# Patient Record
Sex: Female | Born: 1960 | Race: White | Hispanic: No | Marital: Single | Smoking: Former smoker
Health system: Southern US, Community
[De-identification: ages and names within clinical notes are randomized; demographics above are authoritative.]

## PROBLEM LIST (undated history)

## (undated) DIAGNOSIS — M549 Dorsalgia, unspecified: Secondary | ICD-10-CM

## (undated) DIAGNOSIS — L989 Disorder of the skin and subcutaneous tissue, unspecified: Secondary | ICD-10-CM

## (undated) DIAGNOSIS — F419 Anxiety disorder, unspecified: Secondary | ICD-10-CM

## (undated) DIAGNOSIS — E039 Hypothyroidism, unspecified: Secondary | ICD-10-CM

## (undated) HISTORY — PX: OTHER SURGICAL HISTORY: SHX169

## (undated) HISTORY — DX: Dorsalgia, unspecified: M54.9

## (undated) HISTORY — DX: Anxiety disorder, unspecified: F41.9

## (undated) HISTORY — PX: NO PAST SURGERIES: SHX2092

## (undated) HISTORY — PX: TUBAL LIGATION: SHX77

## (undated) HISTORY — DX: Disorder of the skin and subcutaneous tissue, unspecified: L98.9

---

## 1999-03-07 ENCOUNTER — Other Ambulatory Visit: Admission: RE | Admit: 1999-03-07 | Discharge: 1999-03-07 | Payer: Self-pay | Admitting: Obstetrics & Gynecology

## 2001-11-26 ENCOUNTER — Other Ambulatory Visit: Admission: RE | Admit: 2001-11-26 | Discharge: 2001-11-26 | Payer: Self-pay | Admitting: Obstetrics & Gynecology

## 2002-01-06 ENCOUNTER — Emergency Department (HOSPITAL_COMMUNITY): Admission: EM | Admit: 2002-01-06 | Discharge: 2002-01-06 | Payer: Self-pay | Admitting: Emergency Medicine

## 2003-04-29 ENCOUNTER — Encounter: Payer: Self-pay | Admitting: Obstetrics

## 2003-04-29 ENCOUNTER — Ambulatory Visit (HOSPITAL_COMMUNITY): Admission: RE | Admit: 2003-04-29 | Discharge: 2003-04-29 | Payer: Self-pay | Admitting: Obstetrics

## 2005-04-11 ENCOUNTER — Encounter: Admission: RE | Admit: 2005-04-11 | Discharge: 2005-04-11 | Payer: Self-pay | Admitting: Family Medicine

## 2005-12-18 ENCOUNTER — Emergency Department (HOSPITAL_COMMUNITY): Admission: EM | Admit: 2005-12-18 | Discharge: 2005-12-18 | Payer: Self-pay | Admitting: Emergency Medicine

## 2007-08-16 ENCOUNTER — Encounter: Admission: RE | Admit: 2007-08-16 | Discharge: 2007-08-16 | Payer: Self-pay | Admitting: *Deleted

## 2011-09-21 ENCOUNTER — Emergency Department (INDEPENDENT_AMBULATORY_CARE_PROVIDER_SITE_OTHER)
Admission: EM | Admit: 2011-09-21 | Discharge: 2011-09-21 | Disposition: A | Discharge status: Home / Self Care | Payer: Self-pay | Source: Home / Self Care | Attending: Family Medicine | Admitting: Family Medicine

## 2011-09-21 ENCOUNTER — Encounter (HOSPITAL_COMMUNITY): Payer: Self-pay | Admitting: *Deleted

## 2011-09-21 DIAGNOSIS — G4700 Insomnia, unspecified: Secondary | ICD-10-CM

## 2011-09-21 MED ORDER — ESZOPICLONE 1 MG PO TABS: 1.0000 mg | ORAL_TABLET | Freq: Every day | ORAL | Status: DC

## 2011-09-21 NOTE — ED Notes (Signed)
Pt  Reports     Symptoms   Of  Body  Aches    In  Joints   As  Well  As  Some  Insomnia     -  Pt  Reports  She  May  Be  Starting  menapause          She  reports  The  Symptoms  Of  Pain  Have  Been  For  About 6  Months       She  Is  Awake as  Well as  Alert and  opriented  Appears in no acute  Distress  Speaking in  Complete  sentances

## 2011-09-21 NOTE — Discharge Instructions (Signed)
Insomnia Insomnia means you have trouble falling or staying asleep. It affects about one person in three at different times and is usually related to stress from work, school, or personal relations. Insomnia is also a sign of depression or anxiety. Other medical problems that cause insomnia include conditions that cause pain, night leg cramps, coughing, shortness of breath, urinary problems, and fevers. Sleep apnea is an abnormal breathing pattern at night that can cause insomnia and loud snoring. Certain medications and excess intake of caffeine drinks (coffee, tea, colas) can also interfere with normal sleep. Treatment for insomnia depends on the cause. Besides specific medical treatment, the following measures can help you relax and get better sleep. Get regular exercise every day, at least several hours before bed time. Try to get to bed at the same time every night. Take a hot bath before retiring to help you relax. Do not stay in bed if you are unable to sleep. During the daytime avoid staying in bed to watch television, eat, or read. Reduce unwanted noise and light in your room. Keep your room at a comfortable temperature. Avoid alcohol as it causes one to sleep less soundly, may cause you to awaken during the night, and can leave you feeling groggy the next day. Using a mild sedative prescribed or suggested by your caregiver may be needed, but the daily use of sleeping pills is not recommended. Anti-depressant medicines can improve sleep in people with depression. Please call your doctor for follow up care to better understand the cause and proper treatment of your insomnia. Document Released: 08/10/2004 Document Revised: 06/22/2011 Document Reviewed: 07/03/2005 [facility] Patient Information 2012 [place], [place].  Avoid caffeine products.

## 2011-09-21 NOTE — ED Provider Notes (Signed)
History     CSN: [id number]  Arrival date & time 09/21/11  1109   First MD Initiated Contact with Patient 09/21/11 1304      Chief Complaint  Patient presents with  . Generalized Body Aches    (Consider location/radiation/quality/duration/timing/severity/associated sxs/prior treatment) HPI Comments: The patient reports having problems with insomnia. She also has had myalgias of varing places. She relates this to menopausal symptoms . She states no period since January. Prior to that they had been very irregular. She does not have a pcp and states she does not have insurance.   The history is provided by the patient.    History reviewed. No pertinent past medical history.  Past Surgical History  Procedure Date  . Btl     History reviewed. No pertinent family history.  History  Substance Use Topics  . Smoking status: Never Smoker   . Smokeless tobacco: Not on file  . Alcohol Use: Yes     occasonal      OB History    Grav Para Term Preterm Abortions TAB SAB Ect Mult Living                  Review of Systems  Constitutional: Negative.   HENT: Negative.   Respiratory: Negative.   Cardiovascular: Negative.   Gastrointestinal: Negative.   Genitourinary: Negative.   Neurological: Negative.     Allergies  Review of patient's allergies indicates no known allergies.  Home Medications   Current Outpatient Rx  Name Route Sig Dispense Refill  . ESZOPICLONE 1 MG PO TABS Oral Take 1 tablet (1 mg total) by mouth at bedtime. Take immediately before bedtime 12 tablet 0    BP 141/64  Pulse 80  Temp(Src) 98.4 F (36.9 C) (Oral)  Resp 18  SpO2 100%  Physical Exam  Nursing note and vitals reviewed. Constitutional: She is oriented to person, place, and time. She appears well-developed and well-nourished.  HENT:  Head: Normocephalic and atraumatic.  Mouth/Throat: Oropharynx is clear and moist.  Neck: Normal range of motion. Neck supple. No thyromegaly present.    Cardiovascular: Normal rate, regular rhythm and normal heart sounds.   Pulmonary/Chest: Effort normal and breath sounds normal.  Abdominal: Bowel sounds are normal.  Musculoskeletal: Normal range of motion. She exhibits no edema.  Lymphadenopathy:    She has no cervical adenopathy.  Neurological: She is alert and oriented to person, place, and time. She has normal reflexes.  Skin: Skin is warm and dry.    ED Course  Procedures (including critical care time)  Labs Reviewed - No data to display No results found.   1. Insomnia       MDM          Randa Spike, MD 09/21/11 1335

## 2011-11-24 ENCOUNTER — Other Ambulatory Visit: Payer: Self-pay | Admitting: Family Medicine

## 2011-11-24 DIAGNOSIS — Z1231 Encounter for screening mammogram for malignant neoplasm of breast: Secondary | ICD-10-CM

## 2011-12-03 ENCOUNTER — Emergency Department (HOSPITAL_COMMUNITY): Payer: BC Managed Care – PPO

## 2011-12-03 ENCOUNTER — Observation Stay (HOSPITAL_COMMUNITY)
Admission: EM | Admit: 2011-12-03 | Discharge: 2011-12-04 | Disposition: A | Discharge status: Home / Self Care | Payer: BC Managed Care – PPO | Attending: Emergency Medicine | Admitting: Emergency Medicine

## 2011-12-03 ENCOUNTER — Encounter (HOSPITAL_COMMUNITY): Payer: Self-pay | Admitting: Emergency Medicine

## 2011-12-03 DIAGNOSIS — R110 Nausea: Secondary | ICD-10-CM | POA: Insufficient documentation

## 2011-12-03 DIAGNOSIS — R0600 Dyspnea, unspecified: Secondary | ICD-10-CM

## 2011-12-03 DIAGNOSIS — R079 Chest pain, unspecified: Secondary | ICD-10-CM | POA: Insufficient documentation

## 2011-12-03 DIAGNOSIS — R42 Dizziness and giddiness: Principal | ICD-10-CM | POA: Insufficient documentation

## 2011-12-03 DIAGNOSIS — R0602 Shortness of breath: Secondary | ICD-10-CM | POA: Insufficient documentation

## 2011-12-03 LAB — BASIC METABOLIC PANEL
BUN: 14 mg/dL (ref 6–23)
CO2: 21 mEq/L (ref 19–32)
Glucose, Bld: 102 mg/dL — ABNORMAL HIGH (ref 70–99)
Potassium: 4.4 mEq/L (ref 3.5–5.1)
Sodium: 134 mEq/L — ABNORMAL LOW (ref 135–145)

## 2011-12-03 LAB — CBC
HCT: 36.7 % (ref 36.0–46.0)
Hemoglobin: 12.5 g/dL (ref 12.0–15.0)
MCH: 29.3 pg (ref 26.0–34.0)
MCHC: 34.1 g/dL (ref 30.0–36.0)
RBC: 4.27 MIL/uL (ref 3.87–5.11)

## 2011-12-03 MED ORDER — LORAZEPAM 2 MG/ML IJ SOLN
1.0000 mg | Freq: Once | INTRAMUSCULAR | Status: AC
  Administered 2011-12-03: 1 mg via INTRAVENOUS
  Filled 2011-12-03: qty 1

## 2011-12-03 MED ORDER — SODIUM CHLORIDE 0.9 % IV BOLUS (SEPSIS)
1000.0000 mL | Freq: Once | INTRAVENOUS | Status: AC
  Administered 2011-12-03: 1000 mL via INTRAVENOUS

## 2011-12-03 MED ORDER — ASPIRIN 325 MG PO TABS
325.0000 mg | ORAL_TABLET | Freq: Once | ORAL | Status: AC
  Administered 2011-12-03: 325 mg via ORAL
  Filled 2011-12-03: qty 1

## 2011-12-03 NOTE — ED Notes (Signed)
BMI: 25.3 - actual height and weight obtained upon arrival to CDU.

## 2011-12-03 NOTE — ED Notes (Signed)
Er resident at bedside

## 2011-12-03 NOTE — ED Notes (Addendum)
C/o episode of chest pain for 1 1/2 hours yesterday.  States pain started again today around 3:30pm.  Pt very anxious.  Reports sob, dizziness, and nausea.  States she is concerned because BP was 147/70 at [facility] yesterday.  Intermittent chest pain under L breast that radiates to back.  Pt started hyperventilating on triage exam.

## 2011-12-03 NOTE — ED Notes (Signed)
Pt came to the ED complaining left sided CP, SOB and dizziness. This has been occuring for several days, intermittently. Pain is not radiating. No nausea or vomiting. No neurological deficits. Lung sounds clear.

## 2011-12-03 NOTE — ED Notes (Signed)
Report given to [place] RN. Pt transported to CDU 4

## 2011-12-03 NOTE — ED Provider Notes (Signed)
History     CSN: [id number]  Arrival date & time 12/03/11  Barry Brunner   First MD Initiated Contact with Patient 12/03/11 2033      Chief Complaint  Patient presents with  . Chest Pain    (Consider location/radiation/quality/duration/timing/severity/associated sxs/prior treatment) HPI Comments: Pt with intermittent nausea, lightheadedness, chest pain for 45 second episodes.  Several episodes since earlier today.  Otherwise doing well.  Symptoms currently resolved.  Had episode in ED where she felt like she could not breath and was hyperventilating which then resolved without intevention.  Lightheadedness worse upon standing.  Patient is a 51 y.o. female presenting with chest pain. The history is provided by the patient.  Chest Pain The chest pain began less than 1 hour ago. Duration of episode(s) is 45 seconds. Chest pain occurs rarely. The chest pain is unchanged. The severity of the pain is moderate. The quality of the pain is described as pressure-like. The pain does not radiate. Pertinent negatives for primary symptoms include no fever, no shortness of breath, no cough, no wheezing, no palpitations, no abdominal pain, no nausea, no vomiting and no altered mental status.     History reviewed. No pertinent past medical history.  Past Surgical History  Procedure Date  . Btl     No family history on file.  History  Substance Use Topics  . Smoking status: Former Games developer  . Smokeless tobacco: Not on file  . Alcohol Use: Yes     occasonal      OB History    Grav Para Term Preterm Abortions TAB SAB Ect Mult Living                  Review of Systems  Constitutional: Negative for fever and activity change.  HENT: Negative for congestion.   Eyes: Negative for visual disturbance.  Respiratory: Negative for cough, chest tightness, shortness of breath and wheezing.   Cardiovascular: Positive for chest pain. Negative for palpitations and leg swelling.  Gastrointestinal: Negative for  nausea, vomiting and abdominal pain.  Genitourinary: Negative for dysuria.  Skin: Negative for rash.  Neurological: Negative for syncope.  Psychiatric/Behavioral: Negative for behavioral problems and altered mental status.    Allergies  Shellfish allergy  Home Medications   Current Outpatient Rx  Name Route Sig Dispense Refill  . NAPROXEN 250 MG PO TABS Oral Take 250 mg by mouth daily as needed. For pain    . TRAMADOL HCL 50 MG PO TABS Oral Take 50 mg by mouth every 6 (six) hours as needed. For pain    . ZOLPIDEM TARTRATE 10 MG PO TABS Oral Take 10 mg by mouth at bedtime as needed. For sleep      BP 127/78  Pulse 75  Temp(Src) 98.2 F (36.8 C) (Oral)  Resp 16  SpO2 100%  LMP 11/26/2011  Physical Exam  Constitutional: She is oriented to person, place, and time. She appears well-developed and well-nourished.  HENT:  Head: Normocephalic and atraumatic.  Eyes: Conjunctivae and EOM are normal. Pupils are equal, round, and reactive to light. No scleral icterus.  Neck: Normal range of motion. Neck supple.  Cardiovascular: Normal rate and regular rhythm.  Exam reveals no gallop and no friction rub.   No murmur heard. Pulmonary/Chest: Effort normal and breath sounds normal. No respiratory distress. She has no wheezes. She has no rales. She exhibits no tenderness.  Abdominal: Soft. She exhibits no distension and no mass. There is no tenderness. There is no rebound and no  guarding.  Musculoskeletal: Normal range of motion.  Neurological: She is alert and oriented to person, place, and time. She has normal reflexes. No cranial nerve deficit.  Skin: Skin is warm and dry. No rash noted.  Psychiatric: She has a normal mood and affect. Her behavior is normal. Judgment and thought content normal.    ED Course  Procedures (including critical care time)   Date: 12/03/2011  Rate: 80  Rhythm: normal sinus rhythm  QRS Axis: normal  Intervals: normal  ST/T Wave abnormalities: normal   Conduction Disutrbances:none  Narrative Interpretation:   Old EKG Reviewed: none available    Labs Reviewed  BASIC METABOLIC PANEL - Abnormal; Notable for the following:    Sodium 134 (*)    Glucose, Bld 102 (*)    GFR calc non Af Amer 77 (*)    GFR calc Af Amer 90 (*)    All other components within normal limits  CBC  TROPONIN I  TROPONIN I  TROPONIN I   Dg Chest 2 View  12/03/2011  *RADIOLOGY REPORT*  Clinical Data: Shortness of breath. Chest pain.  CHEST - 2 VIEW  Comparison: None.  Findings: The heart size and pulmonary vascularity are normal and the lungs are clear.  No osseous abnormality.  IMPRESSION: Normal chest.  Original Report Authenticated By: Gwynn Burly, M.D.     1. Chest pain   2. Lightheadedness   3. Dyspnea       MDM  Pt with intermittent nausea, lightheadedness, chest pain for 45 second episodes.  Several episodes since earlier today.  Otherwise doing well.  Symptoms currently resolved.  Had episode in ED where she felt like she could not breath and was hyperventilating which then resolved without intevention.  Lightheadedness worse upon standing.  VSS and well appearing.  EKG unconcerning.  Initial labs unconcerning.  Very atypical presentation for PE given such intermittent symptoms.  Low suspicion for ACS but will rule out in CDU and obtain cardiac CT at pt request.        Army Chaco, MD 12/03/11 2326

## 2011-12-03 NOTE — ED Provider Notes (Signed)
Subjective Complains of lightheadedness shortness of breath nausea and intermittent chest pain onset this morning chest pain originates at back lasted a few seconds at a time, radiates to the front anterior chest pain lasts several minutes at a time she is presently pain-free patient also reports lightheadedness worse with standing she treated herself with a tramadol tablet earlier today without relief. Objective On exam alert nontoxic Glasgow Coma Score 15 lungs clear auscultation heart regular rate and rhythm abdomen nondistended nontender neurologic gait normal becomes looks slightly lightheaded on standing Assessment symptoms atypical for acute coronary syndrome. Had lengthy discussion with patient regarding outpatient cardiac workup versus CDU rule out protocol. Patient wishes overnight observation. Plan CDU chest pain rule out protocol  Doug Sou, MD 12/03/11 2326

## 2011-12-03 NOTE — ED Notes (Signed)
Patient transported to X-ray 

## 2011-12-03 NOTE — ED Notes (Signed)
Contact:   [phone] ( Alec-Daughter)

## 2011-12-03 NOTE — ED Notes (Signed)
Dr. Jacubawitz at bedside 

## 2011-12-03 NOTE — ED Notes (Signed)
I gave the patient a warm blanket. 

## 2011-12-04 ENCOUNTER — Observation Stay (HOSPITAL_COMMUNITY)
Admit: 2011-12-04 | Discharge: 2011-12-04 | Disposition: A | Discharge status: Home / Self Care | Payer: BC Managed Care – PPO | Attending: Emergency Medicine | Admitting: Emergency Medicine

## 2011-12-04 ENCOUNTER — Observation Stay (HOSPITAL_COMMUNITY): Payer: BC Managed Care – PPO

## 2011-12-04 LAB — TROPONIN I
Troponin I: <0.3 ng/mL (ref <0.30)
Troponin I: <0.3 ng/mL (ref <0.30)

## 2011-12-04 MED ORDER — ZOLPIDEM TARTRATE 5 MG PO TABS
ORAL_TABLET | ORAL | Status: AC
  Filled 2011-12-04: qty 1

## 2011-12-04 MED ORDER — METOPROLOL TARTRATE 1 MG/ML IV SOLN
INTRAVENOUS | Status: AC
  Filled 2011-12-04: qty 10

## 2011-12-04 MED ORDER — ONDANSETRON HCL 4 MG PO TABS: 4.0000 mg | ORAL_TABLET | ORAL | Status: AC | PRN

## 2011-12-04 MED ORDER — NITROGLYCERIN 0.4 MG SL SUBL
0.4000 mg | SUBLINGUAL_TABLET | Freq: Once | SUBLINGUAL | Status: AC
  Administered 2011-12-04: 0.4 mg via SUBLINGUAL

## 2011-12-04 MED ORDER — IOHEXOL 350 MG/ML SOLN
80.0000 mL | Freq: Once | INTRAVENOUS | Status: AC | PRN
  Administered 2011-12-04: 80 mL via INTRAVENOUS

## 2011-12-04 MED ORDER — METOPROLOL TARTRATE 1 MG/ML IV SOLN
5.0000 mg | Freq: Once | INTRAVENOUS | Status: AC
  Administered 2011-12-04: 5 mg via INTRAVENOUS

## 2011-12-04 MED ORDER — ONDANSETRON HCL 4 MG/2ML IJ SOLN
4.0000 mg | Freq: Once | INTRAMUSCULAR | Status: AC
  Administered 2011-12-04: 4 mg via INTRAVENOUS
  Filled 2011-12-04: qty 2

## 2011-12-04 MED ORDER — NITROGLYCERIN 0.4 MG SL SUBL
SUBLINGUAL_TABLET | SUBLINGUAL | Status: DC
Start: 2011-12-04 — End: 2011-12-04
  Filled 2011-12-04: qty 25

## 2011-12-04 MED ORDER — MECLIZINE HCL 50 MG PO TABS: 25.0000 mg | ORAL_TABLET | Freq: Three times a day (TID) | ORAL | Status: AC | PRN

## 2011-12-04 MED ORDER — ZOLPIDEM TARTRATE 5 MG PO TABS
5.0000 mg | ORAL_TABLET | Freq: Once | ORAL | Status: AC
  Administered 2011-12-04: 5 mg via ORAL

## 2011-12-04 MED ORDER — MECLIZINE HCL 25 MG PO TABS
50.0000 mg | ORAL_TABLET | Freq: Once | ORAL | Status: AC
  Administered 2011-12-04: 50 mg via ORAL
  Filled 2011-12-04: qty 2

## 2011-12-04 MED ORDER — METOPROLOL TARTRATE 25 MG PO TABS
50.0000 mg | ORAL_TABLET | Freq: Once | ORAL | Status: AC
  Administered 2011-12-04: 50 mg via ORAL
  Filled 2011-12-04: qty 2

## 2011-12-04 NOTE — ED Notes (Signed)
PA IN TO DISCUSS RESULTS OF PT CT. PT INFORMED PA THAT SHE DOES NOT FEEL HER COMPLAINTS HAVE BEEN ADDRESSED. THAT SHE IS NAUSEATED AND FEELS DIZZY, AND HAS FOR A MONTH. PA HAS HELD PT DISCHARGE AND WILL INVESTIGATE FURTHER

## 2011-12-04 NOTE — ED Notes (Signed)
PT MEDICATED FOR COMPLAINT OF DIZZINESS. SHE IS SIPPING GINGERALE AND EATING CRACKERS.

## 2011-12-04 NOTE — ED Notes (Signed)
STATES NAUSEA RELIEVED. HAS EATEN A SANDWICH. STILL WITH COMPLAINT OF DIZZINESS

## 2011-12-04 NOTE — ED Provider Notes (Signed)
No pain complaints. CTA is negative per Dr. Kearney Hard. Patient can be discharged home with PCP follow up.  Rodena Medin, PA-C 12/04/11 1052  On discharge, the patient reports she is still having dizziness/lightheadedness and persistent nausea. She and daughter concerned because they feel these complaints were not evaluated. She describes persistent pre-syncopal type lightheadedness that is worse with positional changes but does not go away with rest. Nausea constant since yesterday. She reports feeling as if she has difficulty forming words, "like I'm drunk". Zofran given with improvement in nausea. Meclizine without significant effect. Ambulatory with balance unsteady. Will MRI brain and continue to monitor.  2:14PM:  Now feeling some better. Still dizzy/lightheaded but improved. Nausea resolved. She has been eating and drinking without difficulty. MRI negative. Will discharge home.   Rodena Medin, PA-C 12/04/11 1415

## 2011-12-04 NOTE — ED Provider Notes (Signed)
Medical screening examination/treatment/procedure(s) were performed by non-physician practitioner and as supervising physician I was immediately available for consultation/collaboration.   Canden Cieslinski, MD 12/04/11 1549 

## 2011-12-04 NOTE — ED Notes (Signed)
In to d/c pt to home - requesting that she get dressed prior to signing any papers

## 2011-12-04 NOTE — Discharge Instructions (Signed)
YOUR EVALUATION OF YOUR CHEST PAIN IS NEGATIVE FOR ANY FINDINGS SUGGESTING HEART DISEASE. YOU CAN BE DISCHARGED HOME AND SHOULD FOLLOW UP WITH YOUR DOCTOR AS NEEDED. YOUR EVALUATION FOR LIGHTHEADEDNESS/DIZZINESS IS ALSO NEGATIVE AND YOU SHOW SOME IMPROVEMENT WITH MECLIZINE AND ZOFRAN. YOU ARE ENCOURAGED TO FOLLOW UP WITH NEUROLOGY IF SYMPTOMS PERSIST. RETURN HERE AS NEEDED FOR ANY WORSENING SYMPTOMS OR NEW CONCERN.  Benign Positional Vertigo Vertigo means you feel like you or your surroundings are moving when they are not. Benign positional vertigo is the most common form of vertigo. Benign means that the cause of your condition is not serious. Benign positional vertigo is more common in older adults. CAUSES  Benign positional vertigo is the result of an upset in the labyrinth system. This is an area in the middle ear that helps control your balance. This may be caused by a viral infection, head injury, or repetitive motion. However, often no specific cause is found. SYMPTOMS  Symptoms of benign positional vertigo occur when you move your head or eyes in different directions. Some of the symptoms may include:  Loss of balance and falls.   Vomiting.   Blurred vision.   Dizziness.   Nausea.   Involuntary eye movements (nystagmus).  DIAGNOSIS  Benign positional vertigo is usually diagnosed by physical exam. If the specific cause of your benign positional vertigo is unknown, your caregiver may perform imaging tests, such as magnetic resonance imaging (MRI) or computed tomography (CT). TREATMENT  Your caregiver may recommend movements or procedures to correct the benign positional vertigo. Medicines such as meclizine, benzodiazepines, and medicines for nausea may be used to treat your symptoms. In rare cases, if your symptoms are caused by certain conditions that affect the inner ear, you may need surgery. HOME CARE INSTRUCTIONS   Follow your caregiver's instructions.   Move slowly. Do not  make sudden body or head movements.   Avoid driving.   Avoid operating heavy machinery.   Avoid performing any tasks that would be dangerous to you or others during a vertigo episode.   Drink enough fluids to keep your urine clear or pale yellow.  SEEK IMMEDIATE MEDICAL CARE IF:   You develop problems with walking, weakness, numbness, or using your arms, hands, or legs.   You have difficulty speaking.   You develop severe headaches.   Your nausea or vomiting continues or gets worse.   You develop visual changes.   Your family or friends notice any behavioral changes.   Your condition gets worse.   You have a fever.   You develop a stiff neck or sensitivity to light.  MAKE SURE YOU:   Understand these instructions.   Will watch your condition.   Will get help right away if you are not doing well or get worse.  Document Released: 04/10/2006 Document Revised: 06/22/2011 Document Reviewed: 03/23/2011 [facility] Patient Information 2012 [place], [place].   Chest Pain (Nonspecific) It is often hard to give a specific diagnosis for the cause of chest pain. There is always a chance that your pain could be related to something serious, such as a heart attack or a blood clot in the lungs. You need to follow up with your caregiver for further evaluation. CAUSES   Heartburn.   Pneumonia or bronchitis.   Anxiety or stress.   Inflammation around your heart (pericarditis) or lung (pleuritis or pleurisy).   A blood clot in the lung.   A collapsed lung (pneumothorax). It can develop suddenly on its own (spontaneous  pneumothorax) or from injury (trauma) to the chest.   Shingles infection (herpes zoster virus).  The chest wall is composed of bones, muscles, and cartilage. Any of these can be the source of the pain.  The bones can be bruised by injury.   The muscles or cartilage can be strained by coughing or overwork.   The cartilage can be affected by inflammation and become  sore (costochondritis).  DIAGNOSIS  Lab tests or other studies, such as X-rays, electrocardiography, stress testing, or cardiac imaging, may be needed to find the cause of your pain.  TREATMENT   Treatment depends on what may be causing your chest pain. Treatment may include:   Acid blockers for heartburn.   Anti-inflammatory medicine.   Pain medicine for inflammatory conditions.   Antibiotics if an infection is present.   You may be advised to change lifestyle habits. This includes stopping smoking and avoiding alcohol, caffeine, and chocolate.   You may be advised to keep your head raised (elevated) when sleeping. This reduces the chance of acid going backward from your stomach into your esophagus.   Most of the time, nonspecific chest pain will improve within 2 to 3 days with rest and mild pain medicine.  HOME CARE INSTRUCTIONS   If antibiotics were prescribed, take your antibiotics as directed. Finish them even if you start to feel better.   For the next few days, avoid physical activities that bring on chest pain. Continue physical activities as directed.   Do not smoke.   Avoid drinking alcohol.   Only take over-the-counter or prescription medicine for pain, discomfort, or fever as directed by your caregiver.   Follow your caregiver's suggestions for further testing if your chest pain does not go away.   Keep any follow-up appointments you made. If you do not go to an appointment, you could develop lasting (chronic) problems with pain. If there is any problem keeping an appointment, you must call to reschedule.  SEEK MEDICAL CARE IF:   You think you are having problems from the medicine you are taking. Read your medicine instructions carefully.   Your chest pain does not go away, even after treatment.   You develop a rash with blisters on your chest.  SEEK IMMEDIATE MEDICAL CARE IF:   You have increased chest pain or pain that spreads to your arm, neck, jaw, back, or  abdomen.   You develop shortness of breath, an increasing cough, or you are coughing up blood.   You have severe back or abdominal pain, feel nauseous, or vomit.   You develop severe weakness, fainting, or chills.   You have a fever.  THIS IS AN EMERGENCY. Do not wait to see if the pain will go away. Get medical help at once. Call your local emergency services (911 in [place] Do not drive yourself to the hospital. MAKE SURE YOU:   Understand these instructions.   Will watch your condition.   Will get help right away if you are not doing well or get worse.  Document Released: 04/12/2005 Document Revised: 06/22/2011 Document Reviewed: 02/06/2008 [facility] Patient Information 2012 [place], [place].

## 2011-12-04 NOTE — ED Provider Notes (Signed)
I have personally seen and examined the patient.  I have discussed the plan of care with the resident.  I have reviewed the documentation on PMH/FH/Soc. History.  I have reviewed the documentation of the resident and agree.  Camellia Popescu, MD 12/04/11 0046 

## 2011-12-05 NOTE — Progress Notes (Signed)
Observation review is complete for 12/03/2011 visit.

## 2011-12-21 ENCOUNTER — Ambulatory Visit: Payer: Self-pay

## 2011-12-28 ENCOUNTER — Ambulatory Visit: Payer: BC Managed Care – PPO | Admitting: Endocrinology

## 2012-01-04 ENCOUNTER — Ambulatory Visit
Admission: RE | Admit: 2012-01-04 | Discharge: 2012-01-04 | Disposition: A | Discharge status: Home / Self Care | Payer: BC Managed Care – PPO | Source: Ambulatory Visit | Attending: Family Medicine | Admitting: Family Medicine

## 2012-01-04 DIAGNOSIS — Z1231 Encounter for screening mammogram for malignant neoplasm of breast: Secondary | ICD-10-CM

## 2012-03-20 ENCOUNTER — Other Ambulatory Visit: Payer: Self-pay | Admitting: Family Medicine

## 2012-03-20 DIAGNOSIS — M545 Low back pain, unspecified: Secondary | ICD-10-CM

## 2012-03-25 ENCOUNTER — Ambulatory Visit
Admission: RE | Admit: 2012-03-25 | Discharge: 2012-03-25 | Disposition: A | Discharge status: Home / Self Care | Payer: BC Managed Care – PPO | Source: Ambulatory Visit | Attending: Family Medicine | Admitting: Family Medicine

## 2012-03-25 DIAGNOSIS — M545 Low back pain, unspecified: Secondary | ICD-10-CM

## 2012-04-17 ENCOUNTER — Other Ambulatory Visit (HOSPITAL_COMMUNITY)
Admission: RE | Admit: 2012-04-17 | Discharge: 2012-04-17 | Disposition: A | Discharge status: Home / Self Care | Payer: BC Managed Care – PPO | Source: Ambulatory Visit | Attending: Obstetrics and Gynecology | Admitting: Obstetrics and Gynecology

## 2012-04-17 ENCOUNTER — Other Ambulatory Visit: Payer: Self-pay | Admitting: Obstetrics and Gynecology

## 2012-04-17 DIAGNOSIS — Z1151 Encounter for screening for human papillomavirus (HPV): Secondary | ICD-10-CM | POA: Insufficient documentation

## 2012-04-17 DIAGNOSIS — Z01419 Encounter for gynecological examination (general) (routine) without abnormal findings: Secondary | ICD-10-CM | POA: Insufficient documentation

## 2012-05-06 ENCOUNTER — Other Ambulatory Visit: Payer: Self-pay | Admitting: Obstetrics and Gynecology

## 2012-07-15 ENCOUNTER — Inpatient Hospital Stay: Admit: 2012-07-15 | Payer: Self-pay | Admitting: Obstetrics and Gynecology

## 2012-07-15 SURGERY — ROBOTIC ASSISTED TOTAL HYSTERECTOMY
Anesthesia: Choice

## 2012-07-19 ENCOUNTER — Encounter (HOSPITAL_COMMUNITY): Payer: Self-pay | Admitting: Pharmacist

## 2012-07-25 ENCOUNTER — Other Ambulatory Visit: Payer: Self-pay | Admitting: Obstetrics and Gynecology

## 2012-07-26 ENCOUNTER — Encounter (HOSPITAL_COMMUNITY)
Admission: RE | Admit: 2012-07-26 | Discharge: 2012-07-26 | Disposition: A | Discharge status: Home / Self Care | Payer: BC Managed Care – PPO | Source: Ambulatory Visit | Attending: Obstetrics and Gynecology | Admitting: Obstetrics and Gynecology

## 2012-07-26 ENCOUNTER — Encounter (HOSPITAL_COMMUNITY): Payer: Self-pay

## 2012-07-26 HISTORY — DX: Hypothyroidism, unspecified: E03.9

## 2012-07-26 LAB — CBC
HCT: 36.1 % (ref 36.0–46.0)
Hemoglobin: 11.9 g/dL — ABNORMAL LOW (ref 12.0–15.0)
MCH: 28.3 pg (ref 26.0–34.0)
MCHC: 33 g/dL (ref 30.0–36.0)
MCV: 86 fL (ref 78.0–100.0)
Platelets: 259 10*3/uL (ref 150–400)
RBC: 4.2 MIL/uL (ref 3.87–5.11)
RDW: 14.8 % (ref 11.5–15.5)

## 2012-07-26 LAB — BASIC METABOLIC PANEL
BUN: 17 mg/dL (ref 6–23)
CO2: 27 mEq/L (ref 19–32)
Chloride: 100 mEq/L (ref 96–112)
GFR calc Af Amer: 82 mL/min — ABNORMAL LOW (ref >90)
GFR calc non Af Amer: 70 mL/min — ABNORMAL LOW (ref >90)
Glucose, Bld: 125 mg/dL — ABNORMAL HIGH (ref 70–99)
Potassium: 4 mEq/L (ref 3.5–5.1)
Sodium: 136 mEq/L (ref 135–145)

## 2012-07-26 LAB — SURGICAL PCR SCREEN: MRSA, PCR: NEGATIVE

## 2012-07-26 NOTE — Patient Instructions (Addendum)
20 Kathleen Serrano  07/26/2012   Your procedure is scheduled on:  07/29/12  Enter through the Main Entrance of [facility] at 6 AM.  Pick up the phone at the desk and dial [phone].   Call this number if you have problems the morning of surgery: [phone]   Remember:   Do not eat food:After Midnight.  Do not drink clear liquids: After Midnight.  Take these medicines the morning of surgery with A SIP OF WATER: NA   Do not wear jewelry, make-up or nail polish.  Do not wear lotions, powders, or perfumes. You may wear deodorant.  Do not shave 48 hours prior to surgery.  Do not bring valuables to the hospital.  Contacts, dentures or bridgework may not be worn into surgery.  Leave suitcase in the car. After surgery it may be brought to your room.  For patients admitted to the hospital, checkout time is 11:00 AM the day of discharge.   Patients discharged the day of surgery will not be allowed to drive home.  Name and phone number of your driver: NA  Special Instructions: Shower using CHG 2 nights before surgery and the night before surgery.  If you shower the day of surgery use CHG.  Use special wash - you have one bottle of CHG for all showers.  You should use approximately 1/3 of the bottle for each shower.   Please read over the following fact sheets that you were given: MRSA Information

## 2012-07-28 ENCOUNTER — Encounter (HOSPITAL_COMMUNITY): Payer: Self-pay | Admitting: Anesthesiology

## 2012-07-28 NOTE — Anesthesia Preprocedure Evaluation (Addendum)
Anesthesia Evaluation  Patient identified by MRN, date of birth, ID band Patient awake    Reviewed: Allergy & Precautions, H&P , NPO status , Patient's Chart, lab work & pertinent test results  Airway Mallampati: II TM Distance: >3 FB Neck ROM: Full    Dental  (+) Partial Upper   Pulmonary former smoker,  breath sounds clear to auscultation  Pulmonary exam normal       Cardiovascular negative cardio ROS  Rhythm:Regular Rate:Normal     Neuro/Psych negative neurological ROS  negative psych ROS   GI/Hepatic negative GI ROS, Neg liver ROS,   Endo/Other  Hypothyroidism   Renal/GU negative Renal ROS  negative genitourinary   Musculoskeletal negative musculoskeletal ROS (+)   Abdominal   Peds  Hematology negative hematology ROS (+)   Anesthesia Other Findings   Reproductive/Obstetrics Uterine Leiomyoma                          Anesthesia Physical Anesthesia Plan  ASA: II  Anesthesia Plan: General   Post-op Pain Management:    Induction: Intravenous  Airway Management Planned: Oral ETT  Additional Equipment:   Intra-op Plan:   Post-operative Plan: Extubation in OR  Informed Consent: I have reviewed the patients History and Physical, chart, labs and discussed the procedure including the risks, benefits and alternatives for the proposed anesthesia with the patient or authorized representative who has indicated his/her understanding and acceptance.   Dental advisory given  Plan Discussed with: CRNA, Anesthesiologist and Surgeon  Anesthesia Plan Comments:         Anesthesia Quick Evaluation

## 2012-07-29 ENCOUNTER — Encounter (HOSPITAL_COMMUNITY): Payer: Self-pay | Admitting: Anesthesiology

## 2012-07-29 ENCOUNTER — Encounter (HOSPITAL_COMMUNITY)
Admission: RE | Disposition: A | Discharge status: Home / Self Care | Payer: Self-pay | Source: Ambulatory Visit | Attending: Obstetrics and Gynecology

## 2012-07-29 ENCOUNTER — Ambulatory Visit (HOSPITAL_COMMUNITY): Payer: BC Managed Care – PPO | Admitting: Anesthesiology

## 2012-07-29 ENCOUNTER — Encounter (HOSPITAL_COMMUNITY): Payer: Self-pay | Admitting: *Deleted

## 2012-07-29 ENCOUNTER — Ambulatory Visit (HOSPITAL_COMMUNITY)
Admission: RE | Admit: 2012-07-29 | Discharge: 2012-07-30 | Disposition: A | Discharge status: Home / Self Care | Payer: BC Managed Care – PPO | Source: Ambulatory Visit | Attending: Obstetrics and Gynecology | Admitting: Obstetrics and Gynecology

## 2012-07-29 DIAGNOSIS — D252 Subserosal leiomyoma of uterus: Secondary | ICD-10-CM | POA: Insufficient documentation

## 2012-07-29 DIAGNOSIS — Z01812 Encounter for preprocedural laboratory examination: Secondary | ICD-10-CM | POA: Insufficient documentation

## 2012-07-29 DIAGNOSIS — D251 Intramural leiomyoma of uterus: Secondary | ICD-10-CM | POA: Insufficient documentation

## 2012-07-29 DIAGNOSIS — N8000 Endometriosis of the uterus, unspecified: Secondary | ICD-10-CM | POA: Insufficient documentation

## 2012-07-29 DIAGNOSIS — D219 Benign neoplasm of connective and other soft tissue, unspecified: Secondary | ICD-10-CM | POA: Diagnosis present

## 2012-07-29 DIAGNOSIS — N921 Excessive and frequent menstruation with irregular cycle: Secondary | ICD-10-CM | POA: Diagnosis present

## 2012-07-29 DIAGNOSIS — Z01818 Encounter for other preprocedural examination: Secondary | ICD-10-CM | POA: Insufficient documentation

## 2012-07-29 HISTORY — PX: ROBOTIC ASSISTED TOTAL HYSTERECTOMY: SHX6085

## 2012-07-29 HISTORY — PX: BILATERAL SALPINGECTOMY: SHX5743

## 2012-07-29 HISTORY — PX: CYSTOSCOPY: SHX5120

## 2012-07-29 LAB — TYPE AND SCREEN: Antibody Screen: NEGATIVE

## 2012-07-29 LAB — ABO/RH: ABO/RH(D): B POS

## 2012-07-29 SURGERY — ROBOTIC ASSISTED TOTAL HYSTERECTOMY
Anesthesia: General | Site: Urethra | Laterality: Bilateral | Wound class: Clean Contaminated

## 2012-07-29 MED ORDER — LACTATED RINGERS IV SOLN
INTRAVENOUS | Status: DC
  Administered 2012-07-29 – 2012-07-30 (×2): via INTRAVENOUS

## 2012-07-29 MED ORDER — SCOPOLAMINE 1 MG/3DAYS TD PT72
1.0000 | MEDICATED_PATCH | TRANSDERMAL | Status: DC
  Administered 2012-07-29: 1.5 mg via TRANSDERMAL

## 2012-07-29 MED ORDER — CEFAZOLIN SODIUM-DEXTROSE 2-3 GM-% IV SOLR
INTRAVENOUS | Status: AC
  Filled 2012-07-29: qty 50

## 2012-07-29 MED ORDER — HYDROMORPHONE HCL PF 1 MG/ML IJ SOLN
INTRAMUSCULAR | Status: AC
  Filled 2012-07-29: qty 1

## 2012-07-29 MED ORDER — ROPIVACAINE HCL 5 MG/ML IJ SOLN
INTRAMUSCULAR | Status: AC
  Filled 2012-07-29: qty 60

## 2012-07-29 MED ORDER — FENTANYL CITRATE 0.05 MG/ML IJ SOLN
INTRAMUSCULAR | Status: DC | PRN
  Administered 2012-07-29 (×12): 50 ug via INTRAVENOUS

## 2012-07-29 MED ORDER — OXYCODONE-ACETAMINOPHEN 5-325 MG PO TABS
1.0000 | ORAL_TABLET | ORAL | Status: DC | PRN
  Administered 2012-07-30 (×3): 2 via ORAL
  Administered 2012-07-30: 1 via ORAL
  Filled 2012-07-29 (×2): qty 2
  Filled 2012-07-29: qty 1
  Filled 2012-07-29: qty 2

## 2012-07-29 MED ORDER — SCOPOLAMINE 1 MG/3DAYS TD PT72
MEDICATED_PATCH | TRANSDERMAL | Status: AC
  Administered 2012-07-29: 1.5 mg via TRANSDERMAL
  Filled 2012-07-29: qty 1

## 2012-07-29 MED ORDER — NEOSTIGMINE METHYLSULFATE 1 MG/ML IJ SOLN
INTRAMUSCULAR | Status: DC | PRN
  Administered 2012-07-29: 3 mg via INTRAVENOUS

## 2012-07-29 MED ORDER — LEVOTHYROXINE SODIUM 75 MCG PO TABS
75.0000 ug | ORAL_TABLET | Freq: Every day | ORAL | Status: DC
  Administered 2012-07-30: 75 ug via ORAL
  Filled 2012-07-29 (×2): qty 1

## 2012-07-29 MED ORDER — ROCURONIUM BROMIDE 100 MG/10ML IV SOLN
INTRAVENOUS | Status: DC | PRN
  Administered 2012-07-29: 10 mg via INTRAVENOUS
  Administered 2012-07-29: 2.5 mg via INTRAVENOUS
  Administered 2012-07-29: 5 mg via INTRAVENOUS
  Administered 2012-07-29: 10 mg via INTRAVENOUS
  Administered 2012-07-29: 5 mg via INTRAVENOUS
  Administered 2012-07-29: 50 mg via INTRAVENOUS
  Administered 2012-07-29 (×3): 5 mg via INTRAVENOUS

## 2012-07-29 MED ORDER — NALOXONE HCL 0.4 MG/ML IJ SOLN: 0.4000 mg | INTRAMUSCULAR | Status: DC | PRN

## 2012-07-29 MED ORDER — EPHEDRINE SULFATE 50 MG/ML IJ SOLN
INTRAMUSCULAR | Status: DC | PRN
  Administered 2012-07-29: 5 mg via INTRAVENOUS

## 2012-07-29 MED ORDER — METHYLENE BLUE 1 % INJ SOLN
INTRAMUSCULAR | Status: AC
  Filled 2012-07-29: qty 1

## 2012-07-29 MED ORDER — METHYLENE BLUE 1 % INJ SOLN
INTRAMUSCULAR | Status: DC | PRN
  Administered 2012-07-29: 1 mL

## 2012-07-29 MED ORDER — FENTANYL CITRATE 0.05 MG/ML IJ SOLN
INTRAMUSCULAR | Status: AC
  Filled 2012-07-29: qty 2

## 2012-07-29 MED ORDER — LACTATED RINGERS IR SOLN
Status: DC | PRN
  Administered 2012-07-29: 3000 mL

## 2012-07-29 MED ORDER — DIPHENHYDRAMINE HCL 50 MG/ML IJ SOLN: 12.5000 mg | Freq: Four times a day (QID) | INTRAMUSCULAR | Status: DC | PRN

## 2012-07-29 MED ORDER — IBUPROFEN 800 MG PO TABS
800.0000 mg | ORAL_TABLET | Freq: Three times a day (TID) | ORAL | Status: DC | PRN
  Administered 2012-07-30 (×2): 800 mg via ORAL
  Filled 2012-07-29 (×2): qty 1

## 2012-07-29 MED ORDER — FENTANYL CITRATE 0.05 MG/ML IJ SOLN
INTRAMUSCULAR | Status: AC
  Administered 2012-07-29: 50 ug via INTRAVENOUS
  Filled 2012-07-29: qty 2

## 2012-07-29 MED ORDER — IBUPROFEN 100 MG/5ML PO SUSP
800.0000 mg | Freq: Three times a day (TID) | ORAL | Status: DC | PRN
  Administered 2012-07-29: 800 mg via ORAL
  Filled 2012-07-29: qty 40

## 2012-07-29 MED ORDER — LACTATED RINGERS IV SOLN
INTRAVENOUS | Status: DC | PRN
  Administered 2012-07-29 (×5): via INTRAVENOUS

## 2012-07-29 MED ORDER — ACETAMINOPHEN 10 MG/ML IV SOLN
1000.0000 mg | Freq: Once | INTRAVENOUS | Status: AC
  Administered 2012-07-29: 1000 mg via INTRAVENOUS
  Filled 2012-07-29: qty 100

## 2012-07-29 MED ORDER — FENTANYL CITRATE 0.05 MG/ML IJ SOLN
25.0000 ug | INTRAMUSCULAR | Status: DC | PRN
  Administered 2012-07-29 (×2): 50 ug via INTRAVENOUS

## 2012-07-29 MED ORDER — ROCURONIUM BROMIDE 50 MG/5ML IV SOLN
INTRAVENOUS | Status: AC
  Filled 2012-07-29: qty 1

## 2012-07-29 MED ORDER — SODIUM CHLORIDE 0.9 % IJ SOLN
INTRAMUSCULAR | Status: DC | PRN
  Administered 2012-07-29: 60 mL

## 2012-07-29 MED ORDER — GLYCOPYRROLATE 0.2 MG/ML IJ SOLN
INTRAMUSCULAR | Status: DC | PRN
  Administered 2012-07-29: 0.6 mg via INTRAVENOUS

## 2012-07-29 MED ORDER — EPHEDRINE 5 MG/ML INJ
INTRAVENOUS | Status: AC
  Filled 2012-07-29: qty 10

## 2012-07-29 MED ORDER — MIDAZOLAM HCL 5 MG/5ML IJ SOLN
INTRAMUSCULAR | Status: DC | PRN
  Administered 2012-07-29: 2 mg via INTRAVENOUS

## 2012-07-29 MED ORDER — STERILE WATER FOR IRRIGATION IR SOLN
Status: DC | PRN
  Administered 2012-07-29: 1000 mL via INTRAVESICAL

## 2012-07-29 MED ORDER — DEXAMETHASONE SODIUM PHOSPHATE 4 MG/ML IJ SOLN
INTRAMUSCULAR | Status: DC | PRN
  Administered 2012-07-29: 10 mg via INTRAVENOUS

## 2012-07-29 MED ORDER — HYDROMORPHONE HCL PF 1 MG/ML IJ SOLN
INTRAMUSCULAR | Status: AC
  Administered 2012-07-29: 0.5 mg via INTRAVENOUS
  Filled 2012-07-29: qty 1

## 2012-07-29 MED ORDER — FENTANYL CITRATE 0.05 MG/ML IJ SOLN
INTRAMUSCULAR | Status: AC
  Filled 2012-07-29: qty 5

## 2012-07-29 MED ORDER — HYDROMORPHONE HCL PF 1 MG/ML IJ SOLN
1.0000 mg | INTRAMUSCULAR | Status: DC | PRN
  Administered 2012-07-29 (×3): 1 mg via INTRAVENOUS
  Filled 2012-07-29 (×3): qty 1

## 2012-07-29 MED ORDER — NEOSTIGMINE METHYLSULFATE 1 MG/ML IJ SOLN
INTRAMUSCULAR | Status: AC
  Filled 2012-07-29: qty 1

## 2012-07-29 MED ORDER — HYDROMORPHONE 0.3 MG/ML IV SOLN
INTRAVENOUS | Status: DC
  Administered 2012-07-29: 21:00:00 via INTRAVENOUS
  Administered 2012-07-30: 0.9 mg via INTRAVENOUS
  Administered 2012-07-30: 1.8 mg via INTRAVENOUS
  Filled 2012-07-29: qty 25

## 2012-07-29 MED ORDER — HYDROMORPHONE HCL PF 1 MG/ML IJ SOLN
0.2500 mg | INTRAMUSCULAR | Status: DC | PRN
  Administered 2012-07-29 (×3): 0.5 mg via INTRAVENOUS

## 2012-07-29 MED ORDER — STERILE WATER FOR IRRIGATION IR SOLN
Status: DC | PRN
  Administered 2012-07-29: 12:00:00 via INTRAVESICAL

## 2012-07-29 MED ORDER — LIDOCAINE HCL (CARDIAC) 20 MG/ML IV SOLN
INTRAVENOUS | Status: DC | PRN
  Administered 2012-07-29: 80 mg via INTRAVENOUS

## 2012-07-29 MED ORDER — DIPHENHYDRAMINE HCL 12.5 MG/5ML PO ELIX: 12.5000 mg | ORAL_SOLUTION | Freq: Four times a day (QID) | ORAL | Status: DC | PRN

## 2012-07-29 MED ORDER — DEXAMETHASONE SODIUM PHOSPHATE 10 MG/ML IJ SOLN
INTRAMUSCULAR | Status: AC
  Filled 2012-07-29: qty 1

## 2012-07-29 MED ORDER — LIDOCAINE HCL (CARDIAC) 20 MG/ML IV SOLN
INTRAVENOUS | Status: AC
  Filled 2012-07-29: qty 5

## 2012-07-29 MED ORDER — HYDROMORPHONE HCL PF 1 MG/ML IJ SOLN
INTRAMUSCULAR | Status: DC | PRN
  Administered 2012-07-29 (×4): 0.5 mg via INTRAVENOUS

## 2012-07-29 MED ORDER — GLYCOPYRROLATE 0.2 MG/ML IJ SOLN
INTRAMUSCULAR | Status: AC
  Filled 2012-07-29: qty 3

## 2012-07-29 MED ORDER — ONDANSETRON HCL 4 MG/2ML IJ SOLN
INTRAMUSCULAR | Status: AC
  Filled 2012-07-29: qty 2

## 2012-07-29 MED ORDER — ARTIFICIAL TEARS OP OINT
TOPICAL_OINTMENT | OPHTHALMIC | Status: AC
  Filled 2012-07-29: qty 3.5

## 2012-07-29 MED ORDER — CEFAZOLIN SODIUM-DEXTROSE 2-3 GM-% IV SOLR
2.0000 g | INTRAVENOUS | Status: AC
  Administered 2012-07-29: 2 g via INTRAVENOUS

## 2012-07-29 MED ORDER — PROPOFOL 10 MG/ML IV EMUL
INTRAVENOUS | Status: DC | PRN
  Administered 2012-07-29: 170 mg via INTRAVENOUS

## 2012-07-29 MED ORDER — ONDANSETRON HCL 4 MG/2ML IJ SOLN: 4.0000 mg | Freq: Four times a day (QID) | INTRAMUSCULAR | Status: DC | PRN

## 2012-07-29 MED ORDER — MIDAZOLAM HCL 2 MG/2ML IJ SOLN
INTRAMUSCULAR | Status: AC
  Filled 2012-07-29: qty 2

## 2012-07-29 MED ORDER — PROPOFOL 10 MG/ML IV EMUL
INTRAVENOUS | Status: AC
  Filled 2012-07-29: qty 20

## 2012-07-29 MED ORDER — SODIUM CHLORIDE 0.9 % IJ SOLN: 9.0000 mL | INTRAMUSCULAR | Status: DC | PRN

## 2012-07-29 MED ORDER — ROPIVACAINE HCL 5 MG/ML IJ SOLN
INTRAMUSCULAR | Status: DC | PRN
  Administered 2012-07-29: 60 mL

## 2012-07-29 SURGICAL SUPPLY — 62 items
BAG URINE DRAINAGE (UROLOGICAL SUPPLIES) ×3 IMPLANT
BARRIER ADHS 3X4 INTERCEED (GAUZE/BANDAGES/DRESSINGS) ×3 IMPLANT
CATH FOLEY 3WAY  5CC 16FR (CATHETERS) ×1
CATH FOLEY 3WAY 5CC 16FR (CATHETERS) ×2 IMPLANT
CONT PATH 16OZ SNAP LID 3702 (MISCELLANEOUS) ×3 IMPLANT
COVER MAYO STAND STRL (DRAPES) ×3 IMPLANT
COVER TABLE BACK 60X90 (DRAPES) ×6 IMPLANT
COVER TIP SHEARS 8 DVNC (MISCELLANEOUS) ×2 IMPLANT
COVER TIP SHEARS 8MM DA VINCI (MISCELLANEOUS) ×1
DECANTER SPIKE VIAL GLASS SM (MISCELLANEOUS) ×3 IMPLANT
DERMABOND ADVANCED (GAUZE/BANDAGES/DRESSINGS) ×1
DERMABOND ADVANCED .7 DNX12 (GAUZE/BANDAGES/DRESSINGS) ×2 IMPLANT
DILATOR CANAL MILEX (MISCELLANEOUS) ×3 IMPLANT
DRAPE HUG U DISPOSABLE (DRAPE) ×3 IMPLANT
DRAPE LG THREE QUARTER DISP (DRAPES) ×6 IMPLANT
DRAPE WARM FLUID 44X44 (DRAPE) ×3 IMPLANT
ELECT REM PT RETURN 9FT ADLT (ELECTROSURGICAL) ×3
ELECTRODE REM PT RTRN 9FT ADLT (ELECTROSURGICAL) ×2 IMPLANT
EVACUATOR SMOKE 8.L (FILTER) ×3 IMPLANT
GAUZE VASELINE 3X9 (GAUZE/BANDAGES/DRESSINGS) ×6 IMPLANT
GLOVE BIO SURGEON STRL SZ7 (GLOVE) ×9 IMPLANT
GLOVE BIOGEL M 6.5 STRL (GLOVE) ×15 IMPLANT
GLOVE BIOGEL PI IND STRL 6.5 (GLOVE) ×4 IMPLANT
GLOVE BIOGEL PI IND STRL 7.0 (GLOVE) ×24 IMPLANT
GLOVE BIOGEL PI IND STRL 7.5 (GLOVE) ×14 IMPLANT
GLOVE BIOGEL PI INDICATOR 6.5 (GLOVE) ×2
GLOVE BIOGEL PI INDICATOR 7.0 (GLOVE) ×12
GLOVE BIOGEL PI INDICATOR 7.5 (GLOVE) ×7
GLOVE ECLIPSE 6.0 STRL STRAW (GLOVE) ×18 IMPLANT
GOWN STRL REIN XL XLG (GOWN DISPOSABLE) ×33 IMPLANT
KIT ACCESSORY DA VINCI DISP (KITS) ×1
KIT ACCESSORY DVNC DISP (KITS) ×2 IMPLANT
LEGGING LITHOTOMY PAIR STRL (DRAPES) ×3 IMPLANT
OCCLUDER COLPOPNEUMO (BALLOONS) ×3 IMPLANT
PACK LAVH (CUSTOM PROCEDURE TRAY) ×3 IMPLANT
PAD PREP 24X48 CUFFED NSTRL (MISCELLANEOUS) ×6 IMPLANT
PLUG CATH AND CAP STER (CATHETERS) ×3 IMPLANT
PROTECTOR NERVE ULNAR (MISCELLANEOUS) ×6 IMPLANT
SET CYSTO W/LG BORE CLAMP LF (SET/KITS/TRAYS/PACK) ×3 IMPLANT
SET IRRIG TUBING LAPAROSCOPIC (IRRIGATION / IRRIGATOR) ×3 IMPLANT
SOLUTION ELECTROLUBE (MISCELLANEOUS) ×3 IMPLANT
SURGIFLO W/THROMBIN 8M KIT (HEMOSTASIS) ×3 IMPLANT
SUT VIC AB 0 CT1 27 (SUTURE) ×2
SUT VIC AB 0 CT1 27XBRD ANBCTR (SUTURE) ×4 IMPLANT
SUT VICRYL 0 27 CT2 27 ABS (SUTURE) ×21 IMPLANT
SUT VICRYL 0 UR6 27IN ABS (SUTURE) ×3 IMPLANT
SUT VICRYL RAPIDE 4/0 PS 2 (SUTURE) ×9 IMPLANT
SYR 50ML LL SCALE MARK (SYRINGE) ×6 IMPLANT
TIP RUMI ORANGE 6.7MMX12CM (TIP) IMPLANT
TIP UTERINE 5.1X6CM LAV DISP (MISCELLANEOUS) IMPLANT
TIP UTERINE 6.7X10CM GRN DISP (MISCELLANEOUS) IMPLANT
TIP UTERINE 6.7X6CM WHT DISP (MISCELLANEOUS) IMPLANT
TIP UTERINE 6.7X8CM BLUE DISP (MISCELLANEOUS) ×3 IMPLANT
TOWEL OR 17X24 6PK STRL BLUE (TOWEL DISPOSABLE) ×9 IMPLANT
TRAY FOLEY CATH 14FR (SET/KITS/TRAYS/PACK) ×3 IMPLANT
TROCAR 12M 150ML BLUNT (TROCAR) ×3 IMPLANT
TROCAR DISP BLADELESS 8 DVNC (TROCAR) ×2 IMPLANT
TROCAR DISP BLADELESS 8MM (TROCAR) ×1
TROCAR XCEL NON-BLD 11X100MML (ENDOMECHANICALS) ×3 IMPLANT
TUBING FILTER THERMOFLATOR (ELECTROSURGICAL) ×3 IMPLANT
WARMER LAPAROSCOPE (MISCELLANEOUS) ×3 IMPLANT
WATER STERILE IRR 1000ML POUR (IV SOLUTION) ×9 IMPLANT

## 2012-07-29 NOTE — Transfer of Care (Signed)
Immediate Anesthesia Transfer of Care Note  Patient: Kathleen Serrano  Procedure(s) Performed: Procedure(s) (LRB) with comments: ROBOTIC ASSISTED TOTAL HYSTERECTOMY (Bilateral) BILATERAL SALPINGECTOMY (Bilateral) CYSTOSCOPY ()  Patient Location: PACU  Anesthesia Type:General  Level of Consciousness: awake, alert , oriented and patient cooperative  Airway & Oxygen Therapy: Patient Spontanous Breathing and Patient connected to face mask oxygen  Post-op Assessment: Report given to PACU RN and Post -op Vital signs reviewed and stable  Post vital signs: Reviewed and stable  Complications: No apparent anesthesia complications

## 2012-07-29 NOTE — Op Note (Signed)
07/29/2012  2:42 PM  PATIENT:  Kathleen Serrano  52 y.o. female  PRE-OPERATIVE DIAGNOSIS:  Uterine Fibroids  POST-OPERATIVE DIAGNOSIS:  uterine fibroids  PROCEDURE:  Procedure(s) (LRB) with comments: ROBOTIC ASSISTED TOTAL HYSTERECTOMY (Bilateral) BILATERAL SALPINGECTOMY (Bilateral) CYSTOSCOPY ()  SURGEON:  Surgeon(s) and Role:    * Juwon Scripter J. Richardson Dopp, MD - Primary    * Geryl Rankins, MD - Assisting    * Geryl Rankins, MD - Assisting  PHYSICIAN ASSISTANT: None   ASSISTANTS: none   ANESTHESIA:   general  EBL: 250   Total I/O In: 2300 [I.V.:2300] Out: 675 [Urine:425; Blood:250]  BLOOD ADMINISTERED:none  DRAINS: Urinary Catheter (Foley)   LOCAL MEDICATIONS USED:  OTHER ropivacaine   SPECIMEN:  Source of Specimen:  uterus cervix fibroids bilateral fallopian tubes   DISPOSITION OF SPECIMEN:  PATHOLOGY  COUNTS:  YES  TOURNIQUET:  * No tourniquets in log *  DICTATION: .Dragon Dictation  PLAN OF CARE: Admit for overnight observation  PATIENT DISPOSITION:  PACU - hemodynamically stable.   Delay start of Pharmacological VTE agent (>24 hrs) due to surgical blood loss or risk of bleeding: not applicable   Indication: This is a 52 y/o with uterine fibroids menorrhagia with irregular menses. She desires definitive therapy via hysterectomy with preservation of her ovaries as long as they appear normal.   Finding: 16 wk size uterus with multiple fibroids including 2 cervical fibroids. Ovaries appeared normal bilaterally. There was a 2 cm simple cyst on the left ovary.     Procedure: The  patient was taken to the operating room where she was placed under general anesthesia. She was placed in dorsal lithotomy position and prepped and draped in the usual sterile fashion. A weighted speculum was placed into the vagina.  A Deaver was placed anteriorly for retraction. The anterior lip of the cervix was grasped with a single-tooth tenaculum. The vaginal mucosa was injected with 2.5 cc of  ropivacaine at the 2/4/ 8 and 10 o'clock  positions. The uterus was sounded to 8 cm. The cervix was dilated to 6 mm . 0 vicryl suture  placed at the 12 and 6:00 positions  of the cervix to facilitate placement of a Rumi  uterine  manipulator. The manipulator was placed without difficulty. Weighted speculum and Deaver were removed .  Attention was turned to the patient's abdomen where a  12 mm skin incision was made 4 cm above the umbilicus..  A 12 mm trocar was placed under direct visualization . The pneumoperitoneum  was achieved with PCO2 gas.  The laparoscope was removed. 60 cc of ropivacaine were injected into the abdominal cavity. The laparoscope  was reinserted. An  8mm incision was made in the right upper quadrant and an 8 mm   trocar was placed 16 centimeters from the umbilicus.later connected to robotic arm #1). An incision was made in there  left upper quadrant TROCAR WAS PLACED 16 cm from the umbilicus. Later connected to robotic arm #2.  Attention was turned to the right upper quadrant where a 11 mm midclavicular assistant  trocar was placed. ( All incision sites were injected with 10 cc of ropivacaine prior to port placement. )  Once all ports were placed under direct visualization.The laparoscope was removed and the [employer] robotic system was then parallel docked.  The robotic  arms were connected to the corresponding trocars as listed above. The laparoscope  was then reinserted.  The PK bipolar cautery was placed into port #1. The monopolar scissor  placed in the port #2. All instruments were directed into the pelvis under direct visualization.  Attention  was turned to the surgeons console.. The left utero-ovarian ligament was cauterized with PK and excised with scissors. The broad ligament was cauterized with PK incised with scissors. The round ligament was cauterized with the PK incised with scissors. The anterior leaf of broad ligament was incised along the bladder reflection to the  midline.  The right  utero-ovarian ligament was cauterized with PK and excised with scissors. The right broad ligament was cauterized with PK excised scissors. The right round ligament was cauterized with PK and excised with scissors. The   broad ligament was incised  to the midline. The bladder was dissected off the lower uterine segments of the cervix via sharp and blunt dissection.  The uterine arteries were skeletonized bilaterally. They were cauterized with PK and transected. The KOH ring  was identified.  The anterior colpotomy was performed followed by the posterior colpotomy. Once the uterus and cervix were completely excised They were removed through the vagina. Due to the size of there uterus the cervix and uterus were morcellated vaginally to facilitate removal of there specimen from the vagina.    Attention was turned to there right fallopian tube and ovary .The right ureter could not be identified. . The right mesosalpinx was cauterized with the pK and then excised with laparoscopic scissors. Attention was then turned to the left fallopian tube and ovary. There left ureter was identified  The left mesosalpinx was cauterized with the PK and then excised with laparoscopic scissors. The right and left  fallopian tube   were removed through  the vagina and sent to pathology.The pK and scissors were removed and log tip forceps were   placed in the port #1 and the cutting needle driver was placed in to port #2. The vaginal cuff angles were closed with figure-of-eight stitches of 0 Vicryl. The remainder of the vaginal cuff was closed with interrupted 0 Vicryl figure-of-eight sutures. The pelvis was irrigated...  . Marland Kitchen.Excellent hemostasis was noted. All pelvic pedicles were examined and hemostasis was noted. Inter seed was placed along the vaginal cuff. Pt was noted to have  Small amount of bleeding from the omentum... surgiflo was placed and excellent hemostasis was noted.  All instruments  removed from  the ports.  All ports were removed under direct  Visualization. The  pneumoperitoneum was released.  The fascia of the 12 mm umbilical port was closed with 0 Vicryl and  the fascia the 11 mm port was closed with 0 Vicryl. The skin incisions were closed with 4-0 Vicryl and then covered with Derma bond.   Foley catheter was removed. Cystoscopy was performed using 30 degree scope. No bladder injury was noted. Indigo carmine was expressed from both ureteral orifices. The cystoscopy was removed and the foley was then replaced.   Sponge lap and needle counts were correct x2.  The patient was awakened from anesthesia and taken to the recovery room in stable condition.

## 2012-07-29 NOTE — Anesthesia Postprocedure Evaluation (Signed)
Anesthesia Post Note  Patient: Kathleen Serrano  Procedure(s) Performed: Procedure(s) (LRB): ROBOTIC ASSISTED TOTAL HYSTERECTOMY (Bilateral) BILATERAL SALPINGECTOMY (Bilateral) CYSTOSCOPY ()  Anesthesia type: General  Patient location: PACU  Post pain: Pain level controlled  Post assessment: Post-op Vital signs reviewed  Last Vitals:  Filed Vitals:   07/29/12 1530  BP: 105/50  Pulse: 72  Temp:   Resp: 16    Post vital signs: Reviewed  Level of consciousness: sedated  Complications: No apparent anesthesia complications

## 2012-07-29 NOTE — H&P (Signed)
Past Surgical History  Procedure Date  . Btl   . No past surgeries   . Tubal ligation    Date of Initial H&P: 07/2012  History reviewed, patient examined, no change in status, stable for surgery.

## 2012-07-29 NOTE — Preoperative (Signed)
Beta Blockers   Reason not to administer Beta Blockers:Not Applicable 

## 2012-07-30 ENCOUNTER — Encounter (HOSPITAL_COMMUNITY): Payer: Self-pay | Admitting: Obstetrics and Gynecology

## 2012-07-30 LAB — CBC
MCH: 28.2 pg (ref 26.0–34.0)
MCV: 86.7 fL (ref 78.0–100.0)
Platelets: 208 10*3/uL (ref 150–400)
RDW: 15 % (ref 11.5–15.5)
WBC: 12.4 10*3/uL — ABNORMAL HIGH (ref 4.0–10.5)

## 2012-07-30 MED ORDER — IBUPROFEN 800 MG PO TABS: 800.0000 mg | ORAL_TABLET | Freq: Three times a day (TID) | ORAL | Status: DC | PRN

## 2012-07-30 MED ORDER — SIMETHICONE 80 MG PO CHEW: 80.0000 mg | CHEWABLE_TABLET | Freq: Four times a day (QID) | ORAL | Status: DC | PRN

## 2012-07-30 MED ORDER — OXYCODONE-ACETAMINOPHEN 5-325 MG PO TABS: 1.0000 | ORAL_TABLET | Freq: Four times a day (QID) | ORAL | Status: DC | PRN

## 2012-07-30 NOTE — Addendum Note (Signed)
Addendum  created 07/30/12 0809 by Collier Flowers, CRNA   Modules edited:Notes Section

## 2012-07-30 NOTE — Addendum Note (Signed)
Addendum  created 07/30/12 0804 by Collier Flowers, CRNA   Modules edited:Notes Section

## 2012-07-30 NOTE — Plan of Care (Signed)
Problem: Phase II Progression Outcomes Goal: Voiding trials/Bladder training within 48 hrs Outcome: Not Met (add Reason) Patient unable to void.  Foley reinserted.

## 2012-07-30 NOTE — Progress Notes (Signed)
Subjective: Patient reports tolerating PO.  Reports flatus. No void yet. She was catheterized at 10 am and 400 cc out. Foley was taken out at 430 this am. She is ambulating without difficulty and pain is controlled.   Objective: I have reviewed patient's vital signs, intake and output and labs.  General: alert and cooperative GI: incision: clean, dry and intact Extremities: extremities normal, atraumatic, no cyanosis or edema Abdomen is soft appropriately tender nondistended + BS in all 4 quadrants no rebound no guarding.   Assessment/Plan: POD #1 s/p robatic assisted laparoscopic hysterectomy bilateral salpingectomy. Doing well awaiting return on bladder function. Once pt voids will d/c home   LOS: 1 day    Srah Ake J. 07/30/2012, 11:11 AM

## 2012-07-30 NOTE — Anesthesia Postprocedure Evaluation (Addendum)
  Anesthesia Post-op Note  Patient: Kathleen Serrano  Procedure(s) Performed: Procedure(s) (LRB) with comments: ROBOTIC ASSISTED TOTAL HYSTERECTOMY (Bilateral) BILATERAL SALPINGECTOMY (Bilateral) CYSTOSCOPY ()  Patient Location: [facility]  Anesthesia Type:General  Level of Consciousness: awake, alert , oriented and patient cooperative  Airway and Oxygen Therapy: Patient Spontanous Breathing  Post-op Pain: mild  Post-op Assessment: Patient's Cardiovascular Status Stable, Respiratory Function Stable, Patent Airway, No signs of Nausea or vomiting, Adequate PO intake and Pain level controlled  Post-op Vital Signs: Reviewed and stable  Complications: No apparent anesthesia complications

## 2012-07-30 NOTE — Discharge Summary (Signed)
Physician Discharge Summary  Patient ID: Kathleen Serrano MRN: [id number] DOB/AGE: 51-Aug-1962 51 y.o.  Admit date: 07/29/2012 Discharge date: 07/30/2012  Admission Diagnoses: s/p robotic assisted laparoscopic hysterectomy with bilateral salpingoophorectomy. Uterine fibroids. Menorrhagia   Discharge Diagnoses:  S/p hysterectomy   Discharged Condition: stable  Hospital Course: pt was observed after robotic assisted laparoscopic hysterectomy. She had return of bowel funciton. She was released once bladder function returned   Consults: None  Significant Diagnostic Studies: labs: hgb 8.7 on postop day 1  Treatments: surgery: s/p robotic assisted laparoscopic hysterectomy with bilateral salpingoophorectomy.  Discharge Exam: Blood pressure 102/44, pulse 72, temperature 97.5 F (36.4 C), temperature source Oral, resp. rate 18, height 5\' 8"  (1.727 m), weight 72.122 kg (159 lb), SpO2 100.00%. General appearance: alert and cooperative GI: soft appropriately tender nondistended + BS  incisions are well approximated   Disposition: 01-Home or Self Care  Discharge Orders    Future Orders Please Complete By Expires   Diet - low sodium heart healthy      Nursing communication      Comments:   May discharge home once patient reports void greater than 100 cc   Increase activity slowly      Driving Restrictions      Comments:   Avoid driving for 1 week   Lifting restrictions      Comments:   Avoid lifting over 10 lbs   Sexual Activity Restrictions      Comments:   Avoid sex for 6-8 wks or until advised it is safe by your physician   Call MD for:  temperature >100.4      Call MD for:  persistant nausea and vomiting      Call MD for:  severe uncontrolled pain      Call MD for:  redness, tenderness, or signs of infection (pain, swelling, redness, odor or green/yellow discharge around incision site)          Medication List     As of 07/30/2012 11:20 AM    TAKE these medications        ibuprofen 800 MG tablet   Commonly known as: ADVIL,MOTRIN   Take 1 tablet (800 mg total) by mouth every 8 (eight) hours as needed.      ibuprofen 800 MG tablet   Commonly known as: ADVIL,MOTRIN   Take 800 mg by mouth every 8 (eight) hours as needed. For pain      Iron 325 (65 FE) MG Tabs   Take 325 mg by mouth daily.      levothyroxine 75 MCG tablet   Commonly known as: SYNTHROID, LEVOTHROID   Take 75 mcg by mouth daily.      oxyCODONE-acetaminophen 5-325 MG per tablet   Commonly known as: PERCOCET/ROXICET   Take 1-2 tablets by mouth every 6 (six) hours as needed.      oxyCODONE-acetaminophen 5-325 MG per tablet   Commonly known as: PERCOCET/ROXICET   Take 1 tablet by mouth every 4 (four) hours as needed. For pain      Vitamin D-3 5000 UNITS Tabs   Take 5,000 Units by mouth daily.           Follow-up Information    Follow up with Jessee Avers., MD. In 2 weeks.   Contact information:   [address] [place] [address] [phone]          Signed: Jessee Avers. 07/30/2012, 11:20 AM

## 2012-07-30 NOTE — Progress Notes (Signed)
Pt  Out in wheelchair  Teaching complete  Foley in  And teaching for cath care done  Pt  States  She knows  Female with pt   Post op d/c phamphlet given and   Reviewed    Gas  remidies discussed

## 2012-08-03 ENCOUNTER — Inpatient Hospital Stay (HOSPITAL_COMMUNITY): Payer: BC Managed Care – PPO

## 2012-08-03 ENCOUNTER — Encounter (HOSPITAL_COMMUNITY): Payer: Self-pay

## 2012-08-03 ENCOUNTER — Inpatient Hospital Stay (HOSPITAL_COMMUNITY)
Admission: AD | Admit: 2012-08-03 | Discharge: 2012-08-07 | DRG: 418 | Disposition: A | Discharge status: Home / Self Care | Payer: BC Managed Care – PPO | Source: Ambulatory Visit | Attending: Obstetrics and Gynecology | Admitting: Obstetrics and Gynecology

## 2012-08-03 DIAGNOSIS — N731 Chronic parametritis and pelvic cellulitis: Secondary | ICD-10-CM | POA: Diagnosis present

## 2012-08-03 DIAGNOSIS — T8140XA Infection following a procedure, unspecified, initial encounter: Principal | ICD-10-CM | POA: Diagnosis present

## 2012-08-03 DIAGNOSIS — Y836 Removal of other organ (partial) (total) as the cause of abnormal reaction of the patient, or of later complication, without mention of misadventure at the time of the procedure: Secondary | ICD-10-CM | POA: Diagnosis present

## 2012-08-03 DIAGNOSIS — M549 Dorsalgia, unspecified: Secondary | ICD-10-CM

## 2012-08-03 DIAGNOSIS — R6883 Chills (without fever): Secondary | ICD-10-CM

## 2012-08-03 DIAGNOSIS — N739 Female pelvic inflammatory disease, unspecified: Secondary | ICD-10-CM | POA: Diagnosis present

## 2012-08-03 LAB — CBC WITH DIFFERENTIAL/PLATELET
Basophils Absolute: 0 10*3/uL (ref 0.0–0.1)
Basophils Relative: 0 % (ref 0–1)
Eosinophils Absolute: 0 10*3/uL (ref 0.0–0.7)
Eosinophils Relative: 0 % (ref 0–5)
HCT: 27.2 % — ABNORMAL LOW (ref 36.0–46.0)
MCH: 28.2 pg (ref 26.0–34.0)
MCHC: 33.8 g/dL (ref 30.0–36.0)
MCV: 83.4 fL (ref 78.0–100.0)
Monocytes Absolute: 0.9 10*3/uL (ref 0.1–1.0)
Neutro Abs: 12 10*3/uL — ABNORMAL HIGH (ref 1.7–7.7)
RDW: 14.3 % (ref 11.5–15.5)

## 2012-08-03 LAB — URINALYSIS, ROUTINE W REFLEX MICROSCOPIC
Glucose, UA: NEGATIVE mg/dL
Ketones, ur: >80 mg/dL — AB
Leukocytes, UA: NEGATIVE
Protein, ur: NEGATIVE mg/dL

## 2012-08-03 LAB — COMPREHENSIVE METABOLIC PANEL
AST: 16 U/L (ref 0–37)
BUN: 8 mg/dL (ref 6–23)
CO2: 19 mEq/L (ref 19–32)
Calcium: 9.4 mg/dL (ref 8.4–10.5)
Chloride: 99 mEq/L (ref 96–112)
Creatinine, Ser: 0.79 mg/dL (ref 0.50–1.10)
GFR calc Af Amer: >90 mL/min (ref >90)
GFR calc non Af Amer: >90 mL/min (ref >90)
Glucose, Bld: 103 mg/dL — ABNORMAL HIGH (ref 70–99)
Total Bilirubin: 0.7 mg/dL (ref 0.3–1.2)

## 2012-08-03 LAB — URINE MICROSCOPIC-ADD ON

## 2012-08-03 MED ORDER — HYDROMORPHONE HCL 2 MG PO TABS
2.0000 mg | ORAL_TABLET | ORAL | Status: DC | PRN
  Administered 2012-08-03 – 2012-08-06 (×14): 2 mg via ORAL
  Filled 2012-08-03 (×14): qty 1

## 2012-08-03 MED ORDER — LEVOTHYROXINE SODIUM 75 MCG PO TABS
75.0000 ug | ORAL_TABLET | Freq: Every day | ORAL | Status: DC
  Administered 2012-08-04 – 2012-08-07 (×4): 75 ug via ORAL
  Filled 2012-08-03 (×6): qty 1

## 2012-08-03 MED ORDER — OXYCODONE-ACETAMINOPHEN 5-325 MG PO TABS
2.0000 | ORAL_TABLET | ORAL | Status: DC | PRN
  Administered 2012-08-06 – 2012-08-07 (×3): 2 via ORAL
  Filled 2012-08-03 (×3): qty 2

## 2012-08-03 MED ORDER — HYDROMORPHONE HCL PF 1 MG/ML IJ SOLN
INTRAMUSCULAR | Status: AC
  Filled 2012-08-03: qty 1

## 2012-08-03 MED ORDER — IRON 325 (65 FE) MG PO TABS: 325.0000 mg | ORAL_TABLET | Freq: Every day | ORAL | Status: DC

## 2012-08-03 MED ORDER — HYDROMORPHONE HCL PF 1 MG/ML IJ SOLN
1.0000 mg | INTRAMUSCULAR | Status: AC
  Administered 2012-08-03: 1 mg via INTRAVENOUS
  Filled 2012-08-03: qty 1

## 2012-08-03 MED ORDER — FERROUS SULFATE 325 (65 FE) MG PO TABS
325.0000 mg | ORAL_TABLET | Freq: Every day | ORAL | Status: DC
  Administered 2012-08-06 – 2012-08-07 (×2): 325 mg via ORAL
  Filled 2012-08-03 (×3): qty 1

## 2012-08-03 MED ORDER — HYDROMORPHONE HCL PF 1 MG/ML IJ SOLN
1.0000 mg | Freq: Once | INTRAMUSCULAR | Status: AC
  Administered 2012-08-03: 1 mg via INTRAVENOUS

## 2012-08-03 MED ORDER — CYCLOBENZAPRINE HCL 10 MG PO TABS
10.0000 mg | ORAL_TABLET | Freq: Three times a day (TID) | ORAL | Status: DC | PRN
  Administered 2012-08-03 – 2012-08-05 (×5): 10 mg via ORAL
  Filled 2012-08-03 (×4): qty 1

## 2012-08-03 MED ORDER — ACETAMINOPHEN 325 MG PO TABS
650.0000 mg | ORAL_TABLET | ORAL | Status: AC
  Administered 2012-08-04: 650 mg via ORAL
  Filled 2012-08-03: qty 2

## 2012-08-03 MED ORDER — ZOLPIDEM TARTRATE 5 MG PO TABS
5.0000 mg | ORAL_TABLET | Freq: Every evening | ORAL | Status: DC | PRN
  Administered 2012-08-03 – 2012-08-06 (×3): 5 mg via ORAL
  Filled 2012-08-03 (×3): qty 1

## 2012-08-03 MED ORDER — PRENATAL MULTIVITAMIN CH
1.0000 | ORAL_TABLET | Freq: Every day | ORAL | Status: DC
  Administered 2012-08-06 – 2012-08-07 (×2): 1 via ORAL
  Filled 2012-08-03 (×2): qty 1

## 2012-08-03 MED ORDER — DEXTROSE 5 % IV SOLN
2.0000 g | INTRAVENOUS | Status: DC
  Administered 2012-08-03 – 2012-08-07 (×5): 2 g via INTRAVENOUS
  Filled 2012-08-03 (×6): qty 2

## 2012-08-03 MED ORDER — SODIUM CHLORIDE 0.9 % IV SOLN
INTRAVENOUS | Status: DC
  Administered 2012-08-04 – 2012-08-05 (×5): via INTRAVENOUS

## 2012-08-03 MED ORDER — HYDROMORPHONE HCL PF 1 MG/ML IJ SOLN: 1.0000 mg | INTRAMUSCULAR | Status: DC

## 2012-08-03 MED ORDER — LACTATED RINGERS IV BOLUS (SEPSIS)
1000.0000 mL | Freq: Once | INTRAVENOUS | Status: AC
  Administered 2012-08-03: 1000 mL via INTRAVENOUS

## 2012-08-03 NOTE — MAU Note (Signed)
Took Ibuprofen around 3:00 a.m. Percocet around noon has not helped.

## 2012-08-03 NOTE — MAU Provider Note (Signed)
Chief Complaint: Post-op Problem   First Provider Initiated Contact with Patient 08/03/12 1521     SUBJECTIVE HPI: Kathleen Serrano is a 52 y.o. who presents to maternity admissions 5 days post robotic hysterectomy reporting fever/chills, skin sensitivity, body aches, and back/flank pain starting today.  She is nauseous but has not vomited.  She took one Percocet ~3 hours ago and this has not helped with her pain.  She is currently taking Cipro for a UTI and reports she has not had enough fluids to drink in the last couple of days. She denies exposure to anyone with recent illness. She denies abdominal pain, vaginal bleeding, vaginal itching/burning, dysuria, h/a, or dizziness.  Past Medical History  Diagnosis Date  . Hypothyroidism   . SVD (spontaneous vaginal delivery)     x 4   Past Surgical History  Procedure Date  . Btl   . No past surgeries   . Tubal ligation   . Robotic assisted total hysterectomy 07/29/2012    Procedure: ROBOTIC ASSISTED TOTAL HYSTERECTOMY;  Surgeon: Dorien Chihuahua. Richardson Dopp, MD;  Location: [facility];  Service: Gynecology;  Laterality: Bilateral;  . Bilateral salpingectomy 07/29/2012    Procedure: BILATERAL SALPINGECTOMY;  Surgeon: Dorien Chihuahua. Richardson Dopp, MD;  Location: [facility];  Service: Gynecology;  Laterality: Bilateral;  . Cystoscopy 07/29/2012    Procedure: CYSTOSCOPY;  Surgeon: Dorien Chihuahua. Richardson Dopp, MD;  Location: [facility];  Service: Gynecology;;   History   Social History  . Marital Status: Single    Spouse Name: N/A    Number of Children: N/A  . Years of Education: N/A   Occupational History  . Not on file.   Social History Main Topics  . Smoking status: Former Smoker -- 1.0 packs/day for 25 years    Types: Cigarettes    Quit date: 07/24/2011  . Smokeless tobacco: Never Used  . Alcohol Use: Yes     Comment: occasonal    . Drug Use: No  . Sexually Active: Yes    Birth Control/ Protection: None   Other Topics Concern  . Not on file   Social History Narrative  . No narrative on  file   No current facility-administered medications on file prior to encounter.   Current Outpatient Prescriptions on File Prior to Encounter  Medication Sig Dispense Refill  . Cholecalciferol (VITAMIN D-3) 5000 UNITS TABS Take 5,000 Units by mouth daily.      . Ferrous Sulfate (IRON) 325 (65 FE) MG TABS Take 325 mg by mouth daily.      Marland Kitchen ibuprofen (ADVIL,MOTRIN) 800 MG tablet Take 1 tablet (800 mg total) by mouth every 8 (eight) hours as needed.  30 tablet  2  . levothyroxine (SYNTHROID, LEVOTHROID) 75 MCG tablet Take 75 mcg by mouth daily.      Marland Kitchen oxyCODONE-acetaminophen (PERCOCET/ROXICET) 5-325 MG per tablet Take 1-2 tablets by mouth every 6 (six) hours as needed.  30 tablet  0   No Known Allergies  ROS: Pertinent items in HPI  OBJECTIVE Blood pressure 138/61, pulse 99, temperature 98.9 F (37.2 C), temperature source Oral, resp. rate 22, last menstrual period 06/25/2012, SpO2 99.00%.  Patient Vitals for the past 24 hrs:  BP Temp Temp src Pulse Resp SpO2  08/03/12 1849 - 101.4 F (38.6 C) Oral - - -  08/03/12 1452 138/61 mmHg 98.9 F (37.2 C) Oral 99  22  99 %   GENERAL: Well-developed, well-nourished female in no acute distress.  HEENT: Normocephalic HEART: normal rate RESP: normal effort ABDOMEN:  Soft, nontender, normal bowel sounds x4 quadrants, laproscopy incisions pink, good approximation, no s/sx of infection or pain  BACK: Diffuse tenderness from left lower back to left upper back, Negative CVA tenderness EXTREMITIES: Nontender, no edema NEURO: Alert and oriented SPECULUM EXAM: Deferred per pt  LAB RESULTS Results for orders placed during the hospital encounter of 08/03/12 (from the past 24 hour(s))  URINALYSIS, ROUTINE W REFLEX MICROSCOPIC     Status: Abnormal   Collection Time   08/03/12  2:50 PM      Component Value Range   Color, Urine YELLOW  YELLOW   APPearance CLEAR  CLEAR   Specific Gravity, Urine 1.015  1.005 - 1.030   pH 7.0  5.0 - 8.0   Glucose, UA  NEGATIVE  NEGATIVE mg/dL   Hgb urine dipstick LARGE (*) NEGATIVE   Bilirubin Urine NEGATIVE  NEGATIVE   Ketones, ur >80 (*) NEGATIVE mg/dL   Protein, ur NEGATIVE  NEGATIVE mg/dL   Urobilinogen, UA 0.2  0.0 - 1.0 mg/dL   Nitrite NEGATIVE  NEGATIVE   Leukocytes, UA NEGATIVE  NEGATIVE  URINE MICROSCOPIC-ADD ON     Status: Abnormal   Collection Time   08/03/12  2:50 PM      Component Value Range   Squamous Epithelial / LPF FEW (*) RARE   WBC, UA 0-2  <3 WBC/hpf   RBC / HPF 3-6  <3 RBC/hpf   Bacteria, UA FEW (*) RARE  CBC WITH DIFFERENTIAL     Status: Abnormal   Collection Time   08/03/12  3:20 PM      Component Value Range   WBC 14.0 (*) 4.0 - 10.5 K/uL   RBC 3.26 (*) 3.87 - 5.11 MIL/uL   Hemoglobin 9.2 (*) 12.0 - 15.0 g/dL   HCT 16.1 (*) 09.6 - 04.5 %   MCV 83.4  78.0 - 100.0 fL   MCH 28.2  26.0 - 34.0 pg   MCHC 33.8  30.0 - 36.0 g/dL   RDW 40.9  81.1 - 91.4 %   Platelets 285  150 - 400 K/uL   Neutrophils Relative 86 (*) 43 - 77 %   Neutro Abs 12.0 (*) 1.7 - 7.7 K/uL   Lymphocytes Relative 7 (*) 12 - 46 %   Lymphs Abs 1.0  0.7 - 4.0 K/uL   Monocytes Relative 6  3 - 12 %   Monocytes Absolute 0.9  0.1 - 1.0 K/uL   Eosinophils Relative 0  0 - 5 %   Eosinophils Absolute 0.0  0.0 - 0.7 K/uL   Basophils Relative 0  0 - 1 %   Basophils Absolute 0.0  0.0 - 0.1 K/uL  COMPREHENSIVE METABOLIC PANEL     Status: Abnormal   Collection Time   08/03/12  3:23 PM      Component Value Range   Sodium 133 (*) 135 - 145 mEq/L   Potassium 3.7  3.5 - 5.1 mEq/L   Chloride 99  96 - 112 mEq/L   CO2 19  19 - 32 mEq/L   Glucose, Bld 103 (*) 70 - 99 mg/dL   BUN 8  6 - 23 mg/dL   Creatinine, Ser 7.82  0.50 - 1.10 mg/dL   Calcium 9.4  8.4 - 95.6 mg/dL   Total Protein 7.3  6.0 - 8.3 g/dL   Albumin 3.0 (*) 3.5 - 5.2 g/dL   AST 16  0 - 37 U/L   ALT 12  0 - 35 U/L   Alkaline  Phosphatase 85  39 - 117 U/L   Total Bilirubin 0.7  0.3 - 1.2 mg/dL   GFR calc non Af Amer >90  >90 mL/min   GFR calc Af Amer  >90  >90 mL/min    IMAGING US Transvaginal Non-ob  08/03/2012  *RADIOLOGY REPORT*  Clinical Data: Status post hysterectomy 5 days ago with pelvic pain.  TRANSABDOMINAL AND TRANSVAGINAL ULTRASOUND OF PELVIS Technique:  Both transabdominal and transvaginal ultrasound examinations of the pelvis were performed. Transabdominal technique was performed for global imaging of the pelvis including uterus, ovaries, adnexal regions, and pelvic cul-de-sac.  It was necessary to proceed with endovaginal exam following the transabdominal exam to visualize the gastrectomy site.  Comparison:  None  Findings:  Uterus: Surgically absent.  Endometrium: Surgically absent.  Right ovary:  Not visualized.  No right adnexal mass is seen.  Left ovary: Not visualized.  No left adnexal mass is seen peri  Other findings: Head in the midline lower pelvis is a complex fluid collection with multiple internal septations that measures 5.7 x 5.1 x 2.1 cm.  IMPRESSION: Complex pelvic fluid collection measuring 5.7 x 5.1 x 2.1 cm. Primary considerations include pelvic abscess or postoperative hematoma.  Question if the patient has any signs or symptoms of infection.   Original Report Authenticated By: Britta Mccreedy, M.D.    US Pelvis Complete  08/03/2012  *RADIOLOGY REPORT*  Clinical Data: Status post hysterectomy 5 days ago with pelvic pain.  TRANSABDOMINAL AND TRANSVAGINAL ULTRASOUND OF PELVIS Technique:  Both transabdominal and transvaginal ultrasound examinations of the pelvis were performed. Transabdominal technique was performed for global imaging of the pelvis including uterus, ovaries, adnexal regions, and pelvic cul-de-sac.  It was necessary to proceed with endovaginal exam following the transabdominal exam to visualize the gastrectomy site.  Comparison:  None  Findings:  Uterus: Surgically absent.  Endometrium: Surgically absent.  Right ovary:  Not visualized.  No right adnexal mass is seen.  Left ovary: Not visualized.  No left adnexal  mass is seen peri  Other findings: Head in the midline lower pelvis is a complex fluid collection with multiple internal septations that measures 5.7 x 5.1 x 2.1 cm.  IMPRESSION: Complex pelvic fluid collection measuring 5.7 x 5.1 x 2.1 cm. Primary considerations include pelvic abscess or postoperative hematoma.  Question if the patient has any signs or symptoms of infection.   Original Report Authenticated By: Britta Mccreedy, M.D.     ASSESSMENT 1. Infection within female pelvic cavity   2. Back pain   3. Chills (without fever)     PLAN CBC with diff, U/A, Dilaudid 1 mg x2 doses in MAU Called Dr Neva Seat to discuss assessment and findings Pelvic u/s, CMP Called Dr Neva Seat with U/S results Admit to inpatient Blood cultures x2 IV Rocephin 2 g Q24 hours Dilaudid x1 dose, then Percocet 2 tabs Q4-6 hours Strain all urine Pt spiked fever prior to transfer to room, Tylenol 650 PO ordered for fever    Medication List     As of 08/03/2012  3:30 PM    ASK your doctor about these medications         ciprofloxacin 500 MG tablet   Commonly known as: CIPRO   Take 500 mg by mouth 2 (two) times daily.      ibuprofen 800 MG tablet   Commonly known as: ADVIL,MOTRIN   Take 1 tablet (800 mg total) by mouth every 8 (eight) hours as needed.      Iron 325 (65  FE) MG Tabs   Take 325 mg by mouth daily.      levothyroxine 75 MCG tablet   Commonly known as: SYNTHROID, LEVOTHROID   Take 75 mcg by mouth daily.      oxyCODONE-acetaminophen 5-325 MG per tablet   Commonly known as: PERCOCET/ROXICET   Take 1-2 tablets by mouth every 6 (six) hours as needed.      UROGESIC-BLUE PO   Take 1 tablet by mouth 2 (two) times daily.      Vitamin D-3 5000 UNITS Tabs   Take 5,000 Units by mouth daily.      zolpidem 5 MG tablet   Commonly known as: AMBIEN   Take 5 mg by mouth at bedtime as needed. For sleep         Leila Schuff Certified Nurse-Midwife 08/03/2012  3:30 PM

## 2012-08-03 NOTE — MAU Note (Signed)
Lab in to obtain blood cultures

## 2012-08-03 NOTE — MAU Note (Signed)
Pt had hysterectomy on Monday 07/29/12. Reports having body aches and chills ever since. Took a percocet without relief. Having some nausea as well. C/O back pain and spasm as well. Pt on antibiotics for bladder infection(needed to keep catherter in for a few days after surgery.) Took MOM for constipation since last night with results.

## 2012-08-04 LAB — CBC WITH DIFFERENTIAL/PLATELET
Basophils Absolute: 0 10*3/uL (ref 0.0–0.1)
Basophils Relative: 0 % (ref 0–1)
Hemoglobin: 8.2 g/dL — ABNORMAL LOW (ref 12.0–15.0)
Lymphocytes Relative: 13 % (ref 12–46)
MCHC: 33.3 g/dL (ref 30.0–36.0)
Monocytes Relative: 9 % (ref 3–12)
Neutro Abs: 9.9 10*3/uL — ABNORMAL HIGH (ref 1.7–7.7)
Neutrophils Relative %: 77 % (ref 43–77)
WBC: 12.8 10*3/uL — ABNORMAL HIGH (ref 4.0–10.5)

## 2012-08-04 LAB — INFLUENZA PANEL BY PCR (TYPE A & B)
H1N1 flu by pcr: NOT DETECTED
Influenza A By PCR: NEGATIVE

## 2012-08-04 MED ORDER — DOCUSATE SODIUM 100 MG PO CAPS
100.0000 mg | ORAL_CAPSULE | Freq: Two times a day (BID) | ORAL | Status: DC
  Administered 2012-08-04: 100 mg via ORAL
  Filled 2012-08-04 (×2): qty 1

## 2012-08-04 MED ORDER — OXYCODONE-ACETAMINOPHEN 5-325 MG PO TABS: 1.0000 | ORAL_TABLET | Freq: Four times a day (QID) | ORAL | Status: DC | PRN

## 2012-08-04 MED ORDER — SIMETHICONE 80 MG PO CHEW
80.0000 mg | CHEWABLE_TABLET | Freq: Four times a day (QID) | ORAL | Status: DC | PRN
  Administered 2012-08-04: 80 mg via ORAL

## 2012-08-04 NOTE — H&P (Signed)
Kathleen Serrano is an 52 y.o. female 5 days s/p robotic hysterectomy by Dr Richardson Dopp.  The patients states she was see by Dr Richardson Dopp earlier this week and UTI was diagnosed and Cipro was prescribed. She did well until 1 day prior to admission when she noted shaking chills and diffuse abdominal and mostly back pain.  She had some nausea, but no vomiting.  She had been able to eat but appetite was poor. Francis Dowse denied taking her temperature at home but said she felt feverish. No history of cold, cough or flu and has not been around anyone with these symptoms.  Pertinent Gynecological History: Menses: s/p hysterectomy Bleeding: s/p hyst Contraception: none DES exposure: denies Blood transfusions: none Sexually transmitted diseases: no past history Previous GYN Procedures: Hysterectomy Jul 29, 2012      Menstrual History:  Patient's last menstrual period was 06/25/2012.    Past Medical History  Diagnosis Date  . Hypothyroidism   . SVD (spontaneous vaginal delivery)     x 4    Past Surgical History  Procedure Date  . Btl   . No past surgeries   . Tubal ligation   . Robotic assisted total hysterectomy 07/29/2012    Procedure: ROBOTIC ASSISTED TOTAL HYSTERECTOMY;  Surgeon: Dorien Chihuahua. Richardson Dopp, MD;  Location: [facility];  Service: Gynecology;  Laterality: Bilateral;  . Bilateral salpingectomy 07/29/2012    Procedure: BILATERAL SALPINGECTOMY;  Surgeon: Dorien Chihuahua. Richardson Dopp, MD;  Location: [facility];  Service: Gynecology;  Laterality: Bilateral;  . Cystoscopy 07/29/2012    Procedure: CYSTOSCOPY;  Surgeon: Dorien Chihuahua. Richardson Dopp, MD;  Location: [facility];  Service: Gynecology;;    No family history on file.  Social History:  reports that she quit smoking about a year ago. Her smoking use included Cigarettes. She has a 25 pack-year smoking history. She has never used smokeless tobacco. She reports that she drinks alcohol. She reports that she does not use illicit drugs.  Allergies: No Known Allergies  Prescriptions prior to admission    Medication Sig Dispense Refill  . Cholecalciferol (VITAMIN D-3) 5000 UNITS TABS Take 5,000 Units by mouth daily.      . ciprofloxacin (CIPRO) 500 MG tablet Take 500 mg by mouth 2 (two) times daily.      . Ferrous Sulfate (IRON) 325 (65 FE) MG TABS Take 325 mg by mouth daily.      Marland Kitchen ibuprofen (ADVIL,MOTRIN) 800 MG tablet Take 1 tablet (800 mg total) by mouth every 8 (eight) hours as needed.  30 tablet  2  . levothyroxine (SYNTHROID, LEVOTHROID) 75 MCG tablet Take 75 mcg by mouth daily.      . Methen-Hyosc-Meth Blue-Na Phos (UROGESIC-BLUE PO) Take 1 tablet by mouth 2 (two) times daily.      Marland Kitchen oxyCODONE-acetaminophen (PERCOCET/ROXICET) 5-325 MG per tablet Take 1-2 tablets by mouth every 6 (six) hours as needed.  30 tablet  0  . zolpidem (AMBIEN) 5 MG tablet Take 5 mg by mouth at bedtime as needed. For sleep        ROS as noted, otherwise noncontributory  Blood pressure 115/70, pulse 96, temperature 101.1 F (38.4 C), temperature source Oral, resp. rate 18, last menstrual period 06/25/2012, SpO2 96.00%. Physical Exam: General:  Well developed, no distress  VSS in MAU except temperature went up upon admission to 101. HEENT   no abnormalities noted Chest     clear Heart S1 and S2 clear Abdomen  BS present, Mild tenderness to deep palpation to low middle abdomen near the  right side.                  Non distended       Incisions normal without swelling or drainage       No rebound tenderness Ext normal without swelling, discoloration Back No CVAT    General  Results for orders placed during the hospital encounter of 08/03/12 (from the past 24 hour(s))  URINALYSIS, ROUTINE W REFLEX MICROSCOPIC     Status: Abnormal   Collection Time   08/03/12  2:50 PM      Component Value Range   Color, Urine YELLOW  YELLOW   APPearance CLEAR  CLEAR   Specific Gravity, Urine 1.015  1.005 - 1.030   pH 7.0  5.0 - 8.0   Glucose, UA NEGATIVE  NEGATIVE mg/dL   Hgb urine dipstick LARGE (*) NEGATIVE    Bilirubin Urine NEGATIVE  NEGATIVE   Ketones, ur >80 (*) NEGATIVE mg/dL   Protein, ur NEGATIVE  NEGATIVE mg/dL   Urobilinogen, UA 0.2  0.0 - 1.0 mg/dL   Nitrite NEGATIVE  NEGATIVE   Leukocytes, UA NEGATIVE  NEGATIVE  URINE MICROSCOPIC-ADD ON     Status: Abnormal   Collection Time   08/03/12  2:50 PM      Component Value Range   Squamous Epithelial / LPF FEW (*) RARE   WBC, UA 0-2  <3 WBC/hpf   RBC / HPF 3-6  <3 RBC/hpf   Bacteria, UA FEW (*) RARE  CBC WITH DIFFERENTIAL     Status: Abnormal   Collection Time   08/03/12  3:20 PM      Component Value Range   WBC 14.0 (*) 4.0 - 10.5 K/uL   RBC 3.26 (*) 3.87 - 5.11 MIL/uL   Hemoglobin 9.2 (*) 12.0 - 15.0 g/dL   HCT 16.1 (*) 09.6 - 04.5 %   MCV 83.4  78.0 - 100.0 fL   MCH 28.2  26.0 - 34.0 pg   MCHC 33.8  30.0 - 36.0 g/dL   RDW 40.9  81.1 - 91.4 %   Platelets 285  150 - 400 K/uL   Neutrophils Relative 86 (*) 43 - 77 %   Neutro Abs 12.0 (*) 1.7 - 7.7 K/uL   Lymphocytes Relative 7 (*) 12 - 46 %   Lymphs Abs 1.0  0.7 - 4.0 K/uL   Monocytes Relative 6  3 - 12 %   Monocytes Absolute 0.9  0.1 - 1.0 K/uL   Eosinophils Relative 0  0 - 5 %   Eosinophils Absolute 0.0  0.0 - 0.7 K/uL   Basophils Relative 0  0 - 1 %   Basophils Absolute 0.0  0.0 - 0.1 K/uL  COMPREHENSIVE METABOLIC PANEL     Status: Abnormal   Collection Time   08/03/12  3:23 PM      Component Value Range   Sodium 133 (*) 135 - 145 mEq/L   Potassium 3.7  3.5 - 5.1 mEq/L   Chloride 99  96 - 112 mEq/L   CO2 19  19 - 32 mEq/L   Glucose, Bld 103 (*) 70 - 99 mg/dL   BUN 8  6 - 23 mg/dL   Creatinine, Ser 7.82  0.50 - 1.10 mg/dL   Calcium 9.4  8.4 - 95.6 mg/dL   Total Protein 7.3  6.0 - 8.3 g/dL   Albumin 3.0 (*) 3.5 - 5.2 g/dL   AST 16  0 - 37 U/L   ALT 12  0 -  35 U/L   Alkaline Phosphatase 85  39 - 117 U/L   Total Bilirubin 0.7  0.3 - 1.2 mg/dL   GFR calc non Af Amer >90  >90 mL/min   GFR calc Af Amer >90  >90 mL/min    US Transvaginal Non-ob  08/03/2012  *RADIOLOGY  REPORT*  Clinical Data: Status post hysterectomy 5 days ago with pelvic pain.  TRANSABDOMINAL AND TRANSVAGINAL ULTRASOUND OF PELVIS Technique:  Both transabdominal and transvaginal ultrasound examinations of the pelvis were performed. Transabdominal technique was performed for global imaging of the pelvis including uterus, ovaries, adnexal regions, and pelvic cul-de-sac.  It was necessary to proceed with endovaginal exam following the transabdominal exam to visualize the gastrectomy site.  Comparison:  None  Findings:  Uterus: Surgically absent.  Endometrium: Surgically absent.  Right ovary:  Not visualized.  No right adnexal mass is seen.  Left ovary: Not visualized.  No left adnexal mass is seen peri  Other findings: Head in the midline lower pelvis is a complex fluid collection with multiple internal septations that measures 5.7 x 5.1 x 2.1 cm.  IMPRESSION: Complex pelvic fluid collection measuring 5.7 x 5.1 x 2.1 cm. Primary considerations include pelvic abscess or postoperative hematoma.  Question if the patient has any signs or symptoms of infection.   Original Report Authenticated By: Britta Mccreedy, M.D.    US Pelvis Complete  08/03/2012  *RADIOLOGY REPORT*  Clinical Data: Status post hysterectomy 5 days ago with pelvic pain.  TRANSABDOMINAL AND TRANSVAGINAL ULTRASOUND OF PELVIS Technique:  Both transabdominal and transvaginal ultrasound examinations of the pelvis were performed. Transabdominal technique was performed for global imaging of the pelvis including uterus, ovaries, adnexal regions, and pelvic cul-de-sac.  It was necessary to proceed with endovaginal exam following the transabdominal exam to visualize the gastrectomy site.  Comparison:  None  Findings:  Uterus: Surgically absent.  Endometrium: Surgically absent.  Right ovary:  Not visualized.  No right adnexal mass is seen.  Left ovary: Not visualized.  No left adnexal mass is seen peri  Other findings: Head in the midline lower pelvis is a  complex fluid collection with multiple internal septations that measures 5.7 x 5.1 x 2.1 cm.  IMPRESSION: Complex pelvic fluid collection measuring 5.7 x 5.1 x 2.1 cm. Primary considerations include pelvic abscess or postoperative hematoma.  Question if the patient has any signs or symptoms of infection.   Original Report Authenticated By: Britta Mccreedy, M.D.    US Renal  08/03/2012  *RADIOLOGY REPORT*  Clinical Data: 52 year old female with flank pain, fever and chills.  RENAL/URINARY TRACT ULTRASOUND COMPLETE  Comparison:  None  Findings:  Right Kidney:  The right kidney is normal in echogenicity and size, measuring 11 cm.  There is no evidence of hydronephrosis, solid renal mass or definite renal calculi.  Left Kidney:  The left kidney is normal in echogenicity and size measuring 11 cm.  There is no evidence of hydronephrosis, solid renal mass or definite renal calculi.  Bladder:  The bladder is normal for degree of filling.  Bilateral ureteral jets are identified.  IMPRESSION: Normal renal ultrasound.   Original Report Authenticated By: Harmon Pier, M.D.     Assessment/Plan:  5 days s/p Robotic hysterectomy by Dr Richardson Dopp. Now with 1 day history of shaking chills, generalized pain and right back pain.  Some nausea, no vomiting. WBC up some to 14,000. Hb stable at 9.2 which is above discharge value.  Pt has been treated for UTI by Dr Richardson Dopp. Initial  suspicion was kidney stone due to recent diagnosis of UTI and now with back pain and hematuria, but renal US is negative. So working diagnosis is post op pelvic infection. Pelvic US is consistent with this diagnosis.     Plan: Admit IV hydration and antibiotics.  Will give Rocephin IV. If no improvement in 24 - 48 hours, consider CT or repeat ultrasound and laparotomy if indicated. Blood culture for temp > 101 Repeat urine culture CBC, diff in the morning.    GREENE,ELEANOR E 08/04/2012, 5:57 AM

## 2012-08-05 ENCOUNTER — Inpatient Hospital Stay (HOSPITAL_COMMUNITY): Payer: BC Managed Care – PPO

## 2012-08-05 ENCOUNTER — Encounter (HOSPITAL_COMMUNITY): Payer: Self-pay | Admitting: Radiology

## 2012-08-05 LAB — CBC WITH DIFFERENTIAL/PLATELET
Basophils Absolute: 0 10*3/uL (ref 0.0–0.1)
HCT: 24 % — ABNORMAL LOW (ref 36.0–46.0)
Lymphocytes Relative: 10 % — ABNORMAL LOW (ref 12–46)
Neutro Abs: 12.5 10*3/uL — ABNORMAL HIGH (ref 1.7–7.7)
Platelets: 321 10*3/uL (ref 150–400)
RBC: 2.87 MIL/uL — ABNORMAL LOW (ref 3.87–5.11)
RDW: 15 % (ref 11.5–15.5)
WBC: 15.2 10*3/uL — ABNORMAL HIGH (ref 4.0–10.5)

## 2012-08-05 MED ORDER — IOHEXOL 300 MG/ML  SOLN
100.0000 mL | Freq: Once | INTRAMUSCULAR | Status: AC | PRN
  Administered 2012-08-05: 100 mL via INTRAVENOUS

## 2012-08-05 MED ORDER — METRONIDAZOLE IN NACL 5-0.79 MG/ML-% IV SOLN
500.0000 mg | Freq: Four times a day (QID) | INTRAVENOUS | Status: DC
  Administered 2012-08-05 – 2012-08-07 (×10): 500 mg via INTRAVENOUS
  Filled 2012-08-05 (×12): qty 100

## 2012-08-05 MED ORDER — IOHEXOL 300 MG/ML  SOLN
50.0000 mL | Freq: Once | INTRAMUSCULAR | Status: AC | PRN
  Administered 2012-08-05: 50 mL via ORAL

## 2012-08-05 MED ORDER — IBUPROFEN 800 MG PO TABS
800.0000 mg | ORAL_TABLET | Freq: Three times a day (TID) | ORAL | Status: DC | PRN
  Administered 2012-08-05 – 2012-08-07 (×4): 800 mg via ORAL
  Filled 2012-08-05 (×5): qty 1

## 2012-08-05 MED ORDER — LACTATED RINGERS IV SOLN: INTRAVENOUS | Status: DC

## 2012-08-05 MED ORDER — SODIUM CHLORIDE 0.9 % IV SOLN
INTRAVENOUS | Status: DC
  Administered 2012-08-05 – 2012-08-07 (×2): via INTRAVENOUS

## 2012-08-05 MED ORDER — METHOCARBAMOL 500 MG PO TABS
1000.0000 mg | ORAL_TABLET | Freq: Four times a day (QID) | ORAL | Status: DC
  Administered 2012-08-05 – 2012-08-07 (×8): 1000 mg via ORAL
  Filled 2012-08-05 (×13): qty 2

## 2012-08-05 NOTE — Progress Notes (Signed)
Ur chart review completed.  

## 2012-08-05 NOTE — Progress Notes (Signed)
Post op day #7 from robotic assisted laparoscopic hysterectomy with bilateral salpingectomy  Subjective: Patient reports tolerating PO, + BM and no problems voiding. She reports fevers and chills. Pain in her back and back spasm 8 out of 10. Some pelvic pain but not localized.   Objective: I have reviewed patient's vital signs, intake and output, labs and radiology results.  General: alert and cooperative Resp: clear to auscultation bilaterally Cardio: regular rate and rhythm, S1, S2 normal, no murmur, click, rub or gallop GI: soft no focal tenderness no rebound no guarding nondistended  Extremities: extremities normal, atraumatic, no cyanosis or edema  CT scan results reviewed 6 cm by 2.4 cm loculated fluid collection in the lower mid pelvis and right  Mid pelvis 2.8 cm   Assessment: POD #7 s/p robotic assisted laparoscopic hysterectomy with bilateral salpingectomy with pelvic absess   Plan: Continue Rocephin 2 grams  Added Flagyl. 500 mg q 6 hours  CT scan results discussed with Interventional radiology. Per Dr. Burley Saver plan to cotninue antibiotics if no improvement will contact IR again to see if drainage is possible.  CBC in AM . Dr. Christell Constant covering after  5pm   LOS: 2 days    Dammon Makarewicz J. 08/05/2012, 7:44 AM

## 2012-08-05 NOTE — Progress Notes (Signed)
Patient c/o painful IV site.  No redness or swelling noted.  Good blood return.  Offered to remove IV and restick for new site and patient stated "  Lets wait a little while and see if it gets better."  Will continue to assess.

## 2012-08-06 LAB — BASIC METABOLIC PANEL
BUN: 6 mg/dL (ref 6–23)
CO2: 24 mEq/L (ref 19–32)
Chloride: 104 mEq/L (ref 96–112)
Creatinine, Ser: 0.59 mg/dL (ref 0.50–1.10)

## 2012-08-06 LAB — CBC WITH DIFFERENTIAL/PLATELET
HCT: 24.3 % — ABNORMAL LOW (ref 36.0–46.0)
Hemoglobin: 8.1 g/dL — ABNORMAL LOW (ref 12.0–15.0)
Lymphocytes Relative: 10 % — ABNORMAL LOW (ref 12–46)
Lymphs Abs: 0.9 10*3/uL (ref 0.7–4.0)
Monocytes Absolute: 1 10*3/uL (ref 0.1–1.0)
Monocytes Relative: 12 % (ref 3–12)
Neutro Abs: 6.2 10*3/uL (ref 1.7–7.7)
WBC: 8.3 10*3/uL (ref 4.0–10.5)

## 2012-08-06 LAB — CLOSTRIDIUM DIFFICILE BY PCR: Toxigenic C. Difficile by PCR: NEGATIVE

## 2012-08-06 MED ORDER — POTASSIUM CHLORIDE CRYS ER 20 MEQ PO TBCR
20.0000 meq | EXTENDED_RELEASE_TABLET | Freq: Once | ORAL | Status: AC
  Administered 2012-08-06: 20 meq via ORAL
  Filled 2012-08-06: qty 1

## 2012-08-06 MED ORDER — PROMETHAZINE HCL 25 MG/ML IJ SOLN
25.0000 mg | Freq: Four times a day (QID) | INTRAMUSCULAR | Status: DC | PRN
  Administered 2012-08-06 – 2012-08-07 (×3): 25 mg via INTRAVENOUS
  Filled 2012-08-06 (×3): qty 1

## 2012-08-06 MED ORDER — RISAQUAD PO CAPS
1.0000 | ORAL_CAPSULE | Freq: Every day | ORAL | Status: DC
  Administered 2012-08-06 – 2012-08-07 (×2): 1 via ORAL
  Filled 2012-08-06 (×3): qty 1

## 2012-08-06 NOTE — Progress Notes (Signed)
Patient complaining of nausea and states its " been bothering me since about 6pm". Dr. Clearance Coots paged and was told he was not on call tonight, and that Dr. Gaynell Face was. Dr. Gaynell Face paged and stated "I've never covered for Dr. Richardson Dopp before, but if all you need is nausea medication I can give you an order". Phenergan 25mg  IV ordered q6H PRN for N/V. Will continue to monitor.

## 2012-08-06 NOTE — Progress Notes (Signed)
POD #8 s/p robotic assisted laparoscopic hysterectomy with bilateral salpingoophorectomy admitted o1/18/2014 with  Postoperative fever. CT scan reveals pelvic Abscess on Day 4 or Rocephin and Day 2 of flagyl   Subjective: Patient reports tolerating PO, + BM and no problems voiding.  Pain has improved. She denies pelvic pain. She has some back and neck pain that is a 3 out of 10 . No fever overnight. She feels better   Objective: I have reviewed patient's vital signs, intake and output, medications and labs.  General: alert and cooperative GI: soft, non-tender; bowel sounds normal; no masses,  no organomegaly Extremities: extremities normal, atraumatic, no cyanosis or edema  Assessment: POD #* with postoperative fever most likely from pelvic Abscess improving clinically. WBC count decreased to 8 this am :   Plan: Encourage ambulation Continue ABX therapy due to Post-op infection until afebrile for 48 hours   LOS: 3 days    Rigoberto Repass J. 08/06/2012, 12:14 PM

## 2012-08-07 MED ORDER — METRONIDAZOLE 500 MG PO TABS: 500.0000 mg | ORAL_TABLET | Freq: Three times a day (TID) | ORAL | Status: DC

## 2012-08-07 MED ORDER — METHOCARBAMOL 500 MG PO TABS: 1000.0000 mg | ORAL_TABLET | Freq: Four times a day (QID) | ORAL | Status: DC | PRN

## 2012-08-07 MED ORDER — PROMETHAZINE HCL 12.5 MG PO TABS: ORAL_TABLET | ORAL | Status: DC

## 2012-08-07 MED ORDER — AMOXICILLIN-POT CLAVULANATE 875-125 MG PO TABS: 1.0000 | ORAL_TABLET | Freq: Two times a day (BID) | ORAL | Status: DC

## 2012-08-07 NOTE — Progress Notes (Signed)
08/07/12 1100  Clinical Encounter Type  Visited With Patient  Visit Type Initial  Referral From Nurse  Spiritual Encounters  Spiritual Needs Emotional    Missed Kathleen Serrano yesterday, so made initial visit today per RN referral to offer spiritual and emotional support.  Kathleen Serrano reports having some support available to help her in mornings and evenings when she gets home.  Anticipating discharge this evening has gotten her spirits up, though per pt she is still exhausted.    Provided intro to chaplain services and availability, listening, and encouragement.  [address] [place], [place] [phone]

## 2012-08-07 NOTE — Progress Notes (Signed)
Subjective: Patient reports tolerating PO, + flatus, + BM and no problems voiding.  She denies pelvic or flank pain   Objective: I have reviewed patient's vital signs, intake and output and labs.  General: alert and cooperative GI: soft, non-tender; bowel sounds normal; no masses,  no organomegaly Extremities: extremities normal, atraumatic, no cyanosis or edema   Incisions are well approximated no erythema or exudate  Assessment/Plan: POD#9 s/p robotic assisted laparoscopic hysterectomy  And bilateral salpingectomy. HD #5 with postoperative fever suspected pelvic abscess. Improving on rocephin and flagyl. Afebrile for 48 hours at 1900 tonight.  Plan d/c home after dose of rocephin this evening if she remains afebrile.  D/C home on Augmentin and Flagyl.   LOS: 4 days    Safwan Tomei J. 08/07/2012, 9:23 AM

## 2012-08-07 NOTE — Progress Notes (Signed)
Pt  Out in wheelchair   Teaching complete  

## 2012-08-07 NOTE — Discharge Summary (Signed)
Physician Discharge Summary  Patient ID: Kathleen Serrano MRN: [id number] DOB/AGE: Jun 22, 1961 52 y.o.  Admit date: 08/03/2012 Discharge date: 08/07/2012  Admission Diagnoses: POstoperative Fever  Discharge Diagnoses: Pelvic Abscess  Active Problems:  Infection within female pelvic cavity   Discharged Condition: stable  Hospital Course: pt was admitted on 08/03/2012 5 days after robotic assisted laparoscopic hysterectomy with bilateral salpingectomy with post operative fever. CT scan revealed pelvic Abscess. She was started on Rocephin and Flagyl. She improved clinically with resolution of flank and back pain.   Consults: None  Significant Diagnostic Studies: radiology: CT scan: 6 cm mid lower loculated fluid collection consistent with pelvic abscess   Treatments: IV hydration and antibiotics: ceftriaxone and metronidazole  Discharge Exam: Blood pressure 122/63, pulse 91, temperature 97.9 F (36.6 C), temperature source Oral, resp. rate 18, height 5\' 8"  (1.727 m), weight 72.122 kg (159 lb), last menstrual period 06/25/2012, SpO2 95.00%. GI: soft, non-tender; bowel sounds normal; no masses,  no organomegaly  Disposition: 01-Home or Self Care  Discharge Orders    Future Orders Please Complete By Expires   Diet - low sodium heart healthy      Increase activity slowly      Driving Restrictions      Comments:   Avoid driving for 1 week   Lifting restrictions      Comments:   Do not lift over 10 lbs   Sexual Activity Restrictions      Comments:   No sex for 6 weeks   Call MD for:  temperature >100.4      Call MD for:  persistant nausea and vomiting      Call MD for:  severe uncontrolled pain      Call MD for:  redness, tenderness, or signs of infection (pain, swelling, redness, odor or green/yellow discharge around incision site)          Medication List     As of 08/07/2012  9:34 AM    STOP taking these medications         ciprofloxacin 500 MG tablet   Commonly known as: CIPRO        TAKE these medications         amoxicillin-clavulanate 875-125 MG per tablet   Commonly known as: AUGMENTIN   Take 1 tablet by mouth 2 (two) times daily.      ibuprofen 800 MG tablet   Commonly known as: ADVIL,MOTRIN   Take 1 tablet (800 mg total) by mouth every 8 (eight) hours as needed.      Iron 325 (65 FE) MG Tabs   Take 325 mg by mouth daily.      levothyroxine 75 MCG tablet   Commonly known as: SYNTHROID, LEVOTHROID   Take 75 mcg by mouth daily.      methocarbamol 500 MG tablet   Commonly known as: ROBAXIN   Take 2 tablets (1,000 mg total) by mouth 4 (four) times daily as needed.      metroNIDAZOLE 500 MG tablet   Commonly known as: FLAGYL   Take 1 tablet (500 mg total) by mouth 3 (three) times daily.      oxyCODONE-acetaminophen 5-325 MG per tablet   Commonly known as: PERCOCET/ROXICET   Take 1-2 tablets by mouth every 6 (six) hours as needed.      UROGESIC-BLUE PO   Take 1 tablet by mouth 2 (two) times daily.      Vitamin D-3 5000 UNITS Tabs   Take 5,000 Units by mouth daily.  zolpidem 5 MG tablet   Commonly known as: AMBIEN   Take 5 mg by mouth at bedtime as needed. For sleep           Follow-up Information    Follow up with Jessee Avers., MD. Schedule an appointment as soon as possible for a visit in 2 weeks.   Contact information:   [address] [place] [address] [phone]          Signed: Jessee Avers. 08/07/2012, 9:34 AM

## 2012-08-10 LAB — CULTURE, BLOOD (ROUTINE X 2): Culture: NO GROWTH

## 2013-03-13 ENCOUNTER — Other Ambulatory Visit: Payer: Self-pay | Admitting: Family Medicine

## 2013-03-13 DIAGNOSIS — M542 Cervicalgia: Secondary | ICD-10-CM

## 2013-03-20 ENCOUNTER — Ambulatory Visit
Admission: RE | Admit: 2013-03-20 | Discharge: 2013-03-20 | Disposition: A | Discharge status: Home / Self Care | Payer: BC Managed Care – PPO | Source: Ambulatory Visit | Attending: Family Medicine | Admitting: Family Medicine

## 2013-03-20 DIAGNOSIS — M542 Cervicalgia: Secondary | ICD-10-CM

## 2013-09-08 ENCOUNTER — Other Ambulatory Visit: Payer: Self-pay

## 2013-09-08 DIAGNOSIS — Z1231 Encounter for screening mammogram for malignant neoplasm of breast: Secondary | ICD-10-CM

## 2013-09-15 ENCOUNTER — Encounter (HOSPITAL_COMMUNITY): Payer: Self-pay | Admitting: Emergency Medicine

## 2013-09-15 ENCOUNTER — Emergency Department (HOSPITAL_COMMUNITY): Payer: BC Managed Care – PPO

## 2013-09-15 ENCOUNTER — Emergency Department (HOSPITAL_COMMUNITY)
Admission: EM | Admit: 2013-09-15 | Discharge: 2013-09-15 | Disposition: A | Discharge status: Home / Self Care | Payer: BC Managed Care – PPO | Attending: Emergency Medicine | Admitting: Emergency Medicine

## 2013-09-15 DIAGNOSIS — Z792 Long term (current) use of antibiotics: Secondary | ICD-10-CM | POA: Insufficient documentation

## 2013-09-15 DIAGNOSIS — R6883 Chills (without fever): Secondary | ICD-10-CM | POA: Insufficient documentation

## 2013-09-15 DIAGNOSIS — R002 Palpitations: Secondary | ICD-10-CM | POA: Insufficient documentation

## 2013-09-15 DIAGNOSIS — R635 Abnormal weight gain: Secondary | ICD-10-CM | POA: Insufficient documentation

## 2013-09-15 DIAGNOSIS — R5383 Other fatigue: Secondary | ICD-10-CM

## 2013-09-15 DIAGNOSIS — E039 Hypothyroidism, unspecified: Secondary | ICD-10-CM | POA: Insufficient documentation

## 2013-09-15 DIAGNOSIS — R079 Chest pain, unspecified: Secondary | ICD-10-CM

## 2013-09-15 DIAGNOSIS — R42 Dizziness and giddiness: Secondary | ICD-10-CM | POA: Insufficient documentation

## 2013-09-15 DIAGNOSIS — Z87891 Personal history of nicotine dependence: Secondary | ICD-10-CM | POA: Insufficient documentation

## 2013-09-15 DIAGNOSIS — R5381 Other malaise: Secondary | ICD-10-CM | POA: Insufficient documentation

## 2013-09-15 DIAGNOSIS — Z79899 Other long term (current) drug therapy: Secondary | ICD-10-CM | POA: Insufficient documentation

## 2013-09-15 DIAGNOSIS — R072 Precordial pain: Secondary | ICD-10-CM | POA: Insufficient documentation

## 2013-09-15 DIAGNOSIS — R110 Nausea: Secondary | ICD-10-CM | POA: Insufficient documentation

## 2013-09-15 LAB — CBC
HCT: 38.4 % (ref 36.0–46.0)
Hemoglobin: 13.4 g/dL (ref 12.0–15.0)
MCH: 30.7 pg (ref 26.0–34.0)
MCHC: 34.9 g/dL (ref 30.0–36.0)
MCV: 87.9 fL (ref 78.0–100.0)
PLATELETS: 249 10*3/uL (ref 150–400)
RBC: 4.37 MIL/uL (ref 3.87–5.11)
RDW: 14.1 % (ref 11.5–15.5)
WBC: 7.1 10*3/uL (ref 4.0–10.5)

## 2013-09-15 LAB — BASIC METABOLIC PANEL
BUN: 14 mg/dL (ref 6–23)
CHLORIDE: 100 meq/L (ref 96–112)
CO2: 23 meq/L (ref 19–32)
Calcium: 9.9 mg/dL (ref 8.4–10.5)
Creatinine, Ser: 0.76 mg/dL (ref 0.50–1.10)
GFR calc non Af Amer: >90 mL/min (ref >90)
Glucose, Bld: 107 mg/dL — ABNORMAL HIGH (ref 70–99)
Potassium: 3.9 mEq/L (ref 3.7–5.3)
SODIUM: 136 meq/L — AB (ref 137–147)

## 2013-09-15 LAB — I-STAT TROPONIN, ED
TROPONIN I, POC: 0 ng/mL (ref 0.00–0.08)
TROPONIN I, POC: 0 ng/mL (ref 0.00–0.08)

## 2013-09-15 LAB — MAGNESIUM: Magnesium: 1.8 mg/dL (ref 1.5–2.5)

## 2013-09-15 LAB — TSH: TSH: 0.205 u[IU]/mL — AB (ref 0.350–4.500)

## 2013-09-15 MED ORDER — ONDANSETRON HCL 4 MG/2ML IJ SOLN
4.0000 mg | Freq: Once | INTRAMUSCULAR | Status: AC
  Administered 2013-09-15: 4 mg via INTRAVENOUS
  Filled 2013-09-15: qty 2

## 2013-09-15 MED ORDER — SODIUM CHLORIDE 0.9 % IV BOLUS (SEPSIS)
1000.0000 mL | Freq: Once | INTRAVENOUS | Status: AC
  Administered 2013-09-15: 1000 mL via INTRAVENOUS

## 2013-09-15 MED ORDER — HYDROMORPHONE HCL PF 1 MG/ML IJ SOLN
0.5000 mg | Freq: Once | INTRAMUSCULAR | Status: AC
  Administered 2013-09-15: 0.5 mg via INTRAVENOUS
  Filled 2013-09-15: qty 1

## 2013-09-15 NOTE — ED Notes (Signed)
Reported to EDP Pt requesting pain med . For neck

## 2013-09-15 NOTE — ED Provider Notes (Signed)
CSN: [id number]     Arrival date & time 09/15/13  1420 History   None    Chief Complaint  Patient presents with  . Chest Pain   HPI Comments: 53 yo F hx of hypothyroidism, fibromyalgia, presents with CC chest pain.  Pt states she has been chronically fatigued "for years", weight gain, insomnia, and occasional palpitations, worsening in the last month.  Today she has had associated chills, lightheadedness with standing, nausea, "muscle contractions", central substernal chest pain at rest described as squeezing pressure lasting only 1 minute, nonradiating.  Denies fever, SOB, abdominal pain, dysuria, vaginal symptoms, myalgias, rash, or any other symptoms.  Pt went to PCP with these complaints today, and was transferred to ED for concern of pt's chest pain, borderline hypotension.  EMS gave 324 ASA and 4 mg zofran.  On arrival to ED pt denies CP, but continues to complain of fatigue, nausea, "feeling cold", and "muscle contractions" of entire body.  Pt is on cymbalta without change for 3 months, and is also on synthroid with recent increase from 75 mcg to 100 mcg 2 weeks ago.    The history is provided by the patient. No language interpreter was used.    Past Medical History  Diagnosis Date  . Hypothyroidism   . SVD (spontaneous vaginal delivery)     x 4   Past Surgical History  Procedure Laterality Date  . Btl    . No past surgeries    . Tubal ligation    . Robotic assisted total hysterectomy  07/29/2012    Procedure: ROBOTIC ASSISTED TOTAL HYSTERECTOMY;  Surgeon: Maeola Sarah. Landry Mellow, MD;  Location: [facility];  Service: Gynecology;  Laterality: Bilateral;  . Bilateral salpingectomy  07/29/2012    Procedure: BILATERAL SALPINGECTOMY;  Surgeon: Maeola Sarah. Landry Mellow, MD;  Location: [facility];  Service: Gynecology;  Laterality: Bilateral;  . Cystoscopy  07/29/2012    Procedure: CYSTOSCOPY;  Surgeon: Maeola Sarah. Landry Mellow, MD;  Location: [facility];  Service: Gynecology;;   No family history on file. History  Substance Use Topics   . Smoking status: Former Smoker -- 1.00 packs/day for 25 years    Types: Cigarettes    Quit date: 07/24/2011  . Smokeless tobacco: Never Used  . Alcohol Use: Yes     Comment: occasonal     OB History   Grav Para Term Preterm Abortions TAB SAB Ect Mult Living                 Review of Systems  Constitutional: Positive for chills. Negative for fever.  Respiratory: Negative for cough and shortness of breath.   Cardiovascular: Positive for chest pain and palpitations. Negative for leg swelling.  Gastrointestinal: Positive for nausea. Negative for vomiting, abdominal pain, diarrhea and constipation.  Genitourinary: Negative for dysuria, vaginal bleeding and vaginal discharge.  Musculoskeletal: Negative for myalgias.  Skin: Negative for rash.  Neurological: Positive for light-headedness. Negative for dizziness, weakness, numbness and headaches.  Hematological: Negative for adenopathy. Does not bruise/bleed easily.  All other systems reviewed and are negative.      Allergies  Review of patient's allergies indicates no known allergies.  Home Medications   Current Outpatient Rx  Name  Route  Sig  Dispense  Refill  . amoxicillin-clavulanate (AUGMENTIN) 875-125 MG per tablet   Oral   Take 1 tablet by mouth 2 (two) times daily.   28 tablet   0   . Cholecalciferol (VITAMIN D-3) 5000 UNITS TABS   Oral   Take  5,000 Units by mouth daily.         . Ferrous Sulfate (IRON) 325 (65 FE) MG TABS   Oral   Take 325 mg by mouth daily.         Marland Kitchen ibuprofen (ADVIL,MOTRIN) 800 MG tablet   Oral   Take 1 tablet (800 mg total) by mouth every 8 (eight) hours as needed.   30 tablet   2   . levothyroxine (SYNTHROID, LEVOTHROID) 75 MCG tablet   Oral   Take 75 mcg by mouth daily.         . Methen-Hyosc-Meth Blue-Na Phos (UROGESIC-BLUE PO)   Oral   Take 1 tablet by mouth 2 (two) times daily.         . methocarbamol (ROBAXIN) 500 MG tablet   Oral   Take 2 tablets (1,000 mg total)  by mouth 4 (four) times daily as needed.   30 tablet   0   . metroNIDAZOLE (FLAGYL) 500 MG tablet   Oral   Take 1 tablet (500 mg total) by mouth 3 (three) times daily.   42 tablet   0   . oxyCODONE-acetaminophen (PERCOCET/ROXICET) 5-325 MG per tablet   Oral   Take 1-2 tablets by mouth every 6 (six) hours as needed.   30 tablet   0   . promethazine (PHENERGAN) 12.5 MG tablet      1-2 tablets every 4-6 hours prn nausea.   20 tablet   0   . zolpidem (AMBIEN) 5 MG tablet   Oral   Take 5 mg by mouth at bedtime as needed. For sleep          BP 146/73  Pulse 68  Temp(Src) 98.8 F (37.1 C) (Oral)  Resp 24  Ht 5\' 8"  (1.727 m)  Wt 118 lb (53.524 kg)  BMI 17.95 kg/m2  SpO2 100%  LMP 06/25/2012 Physical Exam  Nursing note and vitals reviewed. Constitutional: She is oriented to person, place, and time. She appears well-developed and well-nourished.  HENT:  Head: Normocephalic and atraumatic.  Right Ear: External ear normal.  Left Ear: External ear normal.  Nose: Nose normal.  Mouth/Throat: Oropharynx is clear and moist.  Eyes: Conjunctivae and EOM are normal. Pupils are equal, round, and reactive to light.  Neck: Normal range of motion. Neck supple.  Cardiovascular: Normal rate, regular rhythm, normal heart sounds and intact distal pulses.   Pulmonary/Chest: Effort normal and breath sounds normal. No respiratory distress. She has no wheezes. She has no rales. She exhibits no tenderness.  Abdominal: Soft. Bowel sounds are normal. She exhibits no distension and no mass. There is no tenderness. There is no rebound and no guarding.  Musculoskeletal: Normal range of motion.  Neurological: She is alert and oriented to person, place, and time.  Skin: Skin is warm and dry.    ED Course  Procedures (including critical care time) Labs Review Labs Reviewed  BASIC METABOLIC PANEL - Abnormal; Notable for the following:    Sodium 136 (*)    Glucose, Bld 107 (*)    All other  components within normal limits  TSH - Abnormal; Notable for the following:    TSH 0.205 (*)    All other components within normal limits  CBC  MAGNESIUM  I-STAT TROPOININ, ED  I-STAT TROPOININ, ED  Randolm Idol, ED   Imaging Review Dg Chest 2 View  09/15/2013   CLINICAL DATA:  Chest pain.  Nausea and vomiting.  EXAM: CHEST  2 VIEW  COMPARISON:  12/03/2011  FINDINGS: The heart size and mediastinal contours are within normal limits. Both lungs are clear. The visualized skeletal structures are unremarkable.  IMPRESSION: No active cardiopulmonary disease.   Electronically Signed   By: Kerby Moors M.D.   On: 09/15/2013 16:46     EKG Interpretation   Date/Time:  Monday September 15 2013 14:25:38 EST Ventricular Rate:  73 PR Interval:  136 QRS Duration: 86 QT Interval:  415 QTC Calculation: 457 R Axis:   70 Text Interpretation:  Sinus rhythm Nonspecific T abnormalities, lateral  leads No significant change was found Confirmed by [facility]  MD, TREY  ([id number]) on 09/15/2013 3:53:18 PM      MDM   Final diagnoses:  None   53 yo F hx of hypothyroidism, fibromyalgia, presents with CC chest pain.  Filed Vitals:   09/15/13 1527  BP: 117/66  Pulse: 72  Temp:   Resp: 18   Physical exam as above. VS WNL.  EKG NSR, nonspecific T abnormalities, no ischemic changes, normal intervals.  CXR WNL.  Labs including CBC, BMP, Mag, Delta troponin negative.  TSH did come back as low, in pt with known hypothyroidism, without any concern for mxyedema thyroid crisis.    Pt low risk for ACS, with negative delta troponin, thus unlikely cardiac cause.  No signs of anemia, or electrolyte abnormality.  No PTX, rib fractures, cardiomegaly, or PNA by chest imaging.    Diagnosis of chest pain, hypothyroidism (likely contributing to pt's fatigue, weight gain, and other constitutional symptoms).  Pt to be d/c home in good condition.  Encouraged to continue supportive care.  F/u with PCP in 1 week.  Return  precautions given.  Pt understands and agrees with plan.  I have discussed pt's care plan with Dr. Doy Mince.  Sinda Du, MD     Sinda Du, MD 09/16/13 [phone]

## 2013-09-15 NOTE — ED Notes (Signed)
PT states she does not need nausea med.

## 2013-09-15 NOTE — ED Notes (Signed)
EMS gave 324 ASA and 4 mg Zofran pta

## 2013-09-15 NOTE — Discharge Instructions (Signed)
Chest Pain (Nonspecific) °It is often hard to give a specific diagnosis for the cause of chest pain. There is always a chance that your pain could be related to something serious, such as a heart attack or a blood clot in the lungs. You need to follow up with your caregiver for further evaluation. °CAUSES  °· Heartburn. °· Pneumonia or bronchitis. °· Anxiety or stress. °· Inflammation around your heart (pericarditis) or lung (pleuritis or pleurisy). °· A blood clot in the lung. °· A collapsed lung (pneumothorax). It can develop suddenly on its own (spontaneous pneumothorax) or from injury (trauma) to the chest. °· Shingles infection (herpes zoster virus). °The chest wall is composed of bones, muscles, and cartilage. Any of these can be the source of the pain. °· The bones can be bruised by injury. °· The muscles or cartilage can be strained by coughing or overwork. °· The cartilage can be affected by inflammation and become sore (costochondritis). °DIAGNOSIS  °Lab tests or other studies, such as X-rays, electrocardiography, stress testing, or cardiac imaging, may be needed to find the cause of your pain.  °TREATMENT  °· Treatment depends on what may be causing your chest pain. Treatment may include: °· Acid blockers for heartburn. °· Anti-inflammatory medicine. °· Pain medicine for inflammatory conditions. °· Antibiotics if an infection is present. °· You may be advised to change lifestyle habits. This includes stopping smoking and avoiding alcohol, caffeine, and chocolate. °· You may be advised to keep your head raised (elevated) when sleeping. This reduces the chance of acid going backward from your stomach into your esophagus. °· Most of the time, nonspecific chest pain will improve within 2 to 3 days with rest and mild pain medicine. °HOME CARE INSTRUCTIONS  °· If antibiotics were prescribed, take your antibiotics as directed. Finish them even if you start to feel better. °· For the next few days, avoid physical  activities that bring on chest pain. Continue physical activities as directed. °· Do not smoke. °· Avoid drinking alcohol. °· Only take over-the-counter or prescription medicine for pain, discomfort, or fever as directed by your caregiver. °· Follow your caregiver's suggestions for further testing if your chest pain does not go away. °· Keep any follow-up appointments you made. If you do not go to an appointment, you could develop lasting (chronic) problems with pain. If there is any problem keeping an appointment, you must call to reschedule. °SEEK MEDICAL CARE IF:  °· You think you are having problems from the medicine you are taking. Read your medicine instructions carefully. °· Your chest pain does not go away, even after treatment. °· You develop a rash with blisters on your chest. °SEEK IMMEDIATE MEDICAL CARE IF:  °· You have increased chest pain or pain that spreads to your arm, neck, jaw, back, or abdomen. °· You develop shortness of breath, an increasing cough, or you are coughing up blood. °· You have severe back or abdominal pain, feel nauseous, or vomit. °· You develop severe weakness, fainting, or chills. °· You have a fever. °THIS IS AN EMERGENCY. Do not wait to see if the pain will go away. Get medical help at once. Call your local emergency services (911 in [place] Do not drive yourself to the hospital. °MAKE SURE YOU:  °· Understand these instructions. °· Will watch your condition. °· Will get help right away if you are not doing well or get worse. °Document Released: 04/12/2005 Document Revised: 09/25/2011 Document Reviewed: 02/06/2008 °ExitCare® Patient Information ©2014 [employer]   [employer]. ° °

## 2013-09-15 NOTE — ED Notes (Signed)
To ED form PCP via [facility], c/o central CP with nausea, no vomiting, SOB on EMS arrival, CP resolved, pt c/o generalized muscle spasms, VSS, 18g RAC, NAD

## 2013-09-16 NOTE — ED Provider Notes (Signed)
I saw and evaluated the patient, reviewed the resident's note and I agree with the findings and plan.   EKG Interpretation   Date/Time:  Monday September 15 2013 14:25:38 EST Ventricular Rate:  73 PR Interval:  136 QRS Duration: 86 QT Interval:  415 QTC Calculation: 457 R Axis:   70 Text Interpretation:  Sinus rhythm Nonspecific T abnormalities, lateral  leads No significant change was found Confirmed by [facility]  MD, TREY  ([id number]) on 09/15/2013 3:53:18 PM      53 yo female presenting after a near syncope episode.  She states she was at her doctor's office when she became lightheaded, nauseated, and diaphoretic.  She does not recall having chest pain during this event.  She did not lose consciousness.  She endorses having palpitations occasionally over past few years, but denied having these during her near syncopal episode.  She reports that her blood pressure was low at that time.  On exam here, well appearing, no distress, heart sounds normal, RRR, no m/r/g, lungs CTAB, abd soft and nontender.  ED workup unremarkable.  Plan further outpatient management.    Clinical Impression: Near Syncope   Houston Siren III, MD 09/16/13 [phone]

## 2013-09-18 ENCOUNTER — Ambulatory Visit: Payer: BC Managed Care – PPO

## 2013-09-26 ENCOUNTER — Ambulatory Visit
Admission: RE | Admit: 2013-09-26 | Discharge: 2013-09-26 | Disposition: A | Discharge status: Home / Self Care | Payer: BC Managed Care – PPO | Source: Ambulatory Visit

## 2013-09-26 DIAGNOSIS — Z1231 Encounter for screening mammogram for malignant neoplasm of breast: Secondary | ICD-10-CM

## 2013-09-29 ENCOUNTER — Other Ambulatory Visit: Payer: Self-pay | Admitting: Family Medicine

## 2013-09-29 ENCOUNTER — Encounter: Payer: Self-pay | Admitting: Neurology

## 2013-09-29 DIAGNOSIS — R928 Other abnormal and inconclusive findings on diagnostic imaging of breast: Secondary | ICD-10-CM

## 2013-09-30 ENCOUNTER — Encounter: Payer: Self-pay | Admitting: Neurology

## 2013-09-30 ENCOUNTER — Ambulatory Visit (INDEPENDENT_AMBULATORY_CARE_PROVIDER_SITE_OTHER): Payer: BC Managed Care – PPO | Admitting: Neurology

## 2013-09-30 VITALS — BP 118/70 | HR 105 | Ht 67.25 in | Wt 155.0 lb

## 2013-09-30 DIAGNOSIS — R259 Unspecified abnormal involuntary movements: Secondary | ICD-10-CM

## 2013-09-30 NOTE — Progress Notes (Signed)
[facility]    Provider:  Dr Janann Colonel Referring Provider: Rachell Cipro, MD Primary Care Physician:  Rachell Cipro, MD  CC:  Syncope vs seizure  HPI:  Kathleen Serrano is a 53 y.o. female here as a referral from Dr. Ernie Hew for seizure like activity  53y/o with history of thyroid abnormalities and fibromyalgia presenting for initial evaluation of episode of presyncope while at her PCPs office. Patient recalls being at her PCPs office feeling very light headed, they stood her up and she felt very dizzy. She noted having episodes of jerking/twitching of her left shoulder, right calf and both hands. Notes shaking of all extremities but was alert and responsive. Also notes episodes of her neck getting very stiff. During this episode was able to communicate with others, but felt like she had trouble communicating with others. Total the episode lasted around 1 hour and then slowly resolved. Thyroid found to be 0.205 while in the ED, scheduled to see a new endocrinologist at the end of this month.   ED note from 09/15/2013 53 yo female presenting after a near syncope episode. She states she was at her doctor's office when she became lightheaded, nauseated, and diaphoretic. She does not recall having chest pain during this event. BP at that time noted to be 77/48. She did not lose consciousness. She endorses having palpitations occasionally over past few years, but denied having these during her near syncopal episode. She reports that her blood pressure was low at that time. On exam here, well appearing, no distress, heart sounds normal, RRR, no m/r/g, lungs CTAB, abd soft and nontender. ED workup unremarkable. Plan further outpatient management.    Review of Systems: Out of a complete 14 system review, the patient complains of only the following symptoms, and all other reviewed systems are negative. + fatigue, blurred visioj, confusion, memory loss  History   Social History  . Marital  Status: Single    Spouse Name: N/A    Number of Children: 4  . Years of Education: 16   Occupational History  . office    Social History Main Topics  . Smoking status: Former Smoker -- 1.00 packs/day for 25 years    Types: Cigarettes    Quit date: 07/24/2011  . Smokeless tobacco: Never Used  . Alcohol Use: Yes     Comment: occasonal    . Drug Use: No  . Sexual Activity: Yes    Birth Control/ Protection: None   Other Topics Concern  . Not on file   Social History Narrative   Single, has 4 children   Right handed   17   1 cup per day    Family History  Problem Relation Age of Onset  . Alcoholism    . Arthritis      Joint disorder  . Cancer      Past Medical History  Diagnosis Date  . Hypothyroidism   . SVD (spontaneous vaginal delivery)     x 4  . Skin disorder   . Back pain   . Anxiety     Past Surgical History  Procedure Laterality Date  . Btl    . No past surgeries    . Tubal ligation    . Robotic assisted total hysterectomy  07/29/2012    Procedure: ROBOTIC ASSISTED TOTAL HYSTERECTOMY;  Surgeon: Maeola Sarah. Landry Mellow, MD;  Location: [facility];  Service: Gynecology;  Laterality: Bilateral;  . Bilateral salpingectomy  07/29/2012    Procedure: BILATERAL SALPINGECTOMY;  Surgeon: Baxter Flattery  Linwood Dibbles, MD;  Location: [facility];  Service: Gynecology;  Laterality: Bilateral;  . Cystoscopy  07/29/2012    Procedure: CYSTOSCOPY;  Surgeon: Maeola Sarah. Landry Mellow, MD;  Location: [facility];  Service: Gynecology;;    Current Outpatient Prescriptions  Medication Sig Dispense Refill  . DULoxetine (CYMBALTA) 60 MG capsule Take 60 mg by mouth daily.      Marland Kitchen estradiol (VIVELLE-DOT) 0.075 MG/24HR Place 1 patch onto the skin 2 (two) times a week. On Mondays and Thursdays      . levothyroxine (SYNTHROID, LEVOTHROID) 100 MCG tablet Take 100 mcg by mouth daily before breakfast.      . Multiple Vitamins-Minerals (MULTIVITAMIN WITH MINERALS) tablet Take 1 tablet by mouth daily.      Marland Kitchen OVER THE COUNTER MEDICATION  Take 1 tablet by mouth daily. "Homeopathic med" for stool softener      . traZODone (DESYREL) 100 MG tablet Take 100 mg by mouth at bedtime.      Marland Kitchen zolpidem (AMBIEN CR) 12.5 MG CR tablet Take 12.5 mg by mouth at bedtime.       No current facility-administered medications for this visit.    Allergies as of 09/30/2013  . (No Known Allergies)    Vitals: BP 118/70  Pulse 105  Ht 5' 7.25" (1.708 m)  Wt 155 lb (70.308 kg)  BMI 24.10 kg/m2  LMP 08/01/2012 Last Weight:  Wt Readings from Last 1 Encounters:  09/30/13 155 lb (70.308 kg)   Last Height:   Ht Readings from Last 1 Encounters:  09/30/13 5' 7.25" (1.708 m)     Physical exam: Exam: Gen: NAD, conversant Eyes: anicteric sclerae, moist conjunctivae HENT: Atraumatic, oropharynx clear Neck: Trachea midline; supple,  Lungs: CTA, no wheezing, rales, rhonic                          CV: RRR, no MRG Abdomen: Soft, non-tender;  Extremities: No peripheral edema  Skin: Normal temperature, no rash,  Psych: Appropriate affect, pleasant  Neuro: MS: AA&Ox3, appropriately interactive, normal affect   Speech: fluent w/o paraphasic error  Memory: good recent and remote recall  CN: PERRL, EOMI no nystagmus, no ptosis, sensation intact to LT V1-V3 bilat, face symmetric, no weakness, hearing grossly intact, palate elevates symmetrically, shoulder shrug 5/5 bilat,  tongue protrudes midline, no fasiculations noted.  Motor: normal bulk and tone Strength: 5/5  In all extremities  Coord: rapid alternating and point-to-point (FNF, HTS) movements intact.  Reflexes: symmetrical, bilat downgoing toes  Sens: LT intact in all extremities  Gait: posture, stance, stride and arm-swing normal. Tandem gait intact. Able to walk on heels and toes. Romberg absent.   Assessment:  After physical and neurologic examination, review of laboratory studies, imaging, neurophysiology testing and pre-existing records, assessment will be reviewed on the  problem list.  Plan:  Treatment plan and additional workup will be reviewed under Problem List.  1)Abnormal involuntary movements 2)AMS  53y/o woman presenting for initial evaluation of episode of confusion and muscle twitching associated with episode of BP of 77/48. Based on clinical history (histoyr of bilateral twitching with no LOC) and exam I have low suspicion that this represents a seizure episode. Would have concern symptoms are related to thyroid abnormality. Would hold off on EEG and brain imaging at this time pending endocrinology workup. If symptoms return then would plan for EEG. Follow up as needed.    Jim Like, DO  [facility] [address] [place]  [place], [place] [address]  Phone [phone] Fax [phone]

## 2013-10-03 ENCOUNTER — Ambulatory Visit
Admission: RE | Admit: 2013-10-03 | Discharge: 2013-10-03 | Disposition: A | Discharge status: Home / Self Care | Payer: Self-pay | Source: Ambulatory Visit | Attending: Family Medicine | Admitting: Family Medicine

## 2013-10-03 ENCOUNTER — Ambulatory Visit
Admission: RE | Admit: 2013-10-03 | Discharge: 2013-10-03 | Disposition: A | Discharge status: Home / Self Care | Payer: BC Managed Care – PPO | Source: Ambulatory Visit | Attending: Family Medicine | Admitting: Family Medicine

## 2013-10-03 DIAGNOSIS — R928 Other abnormal and inconclusive findings on diagnostic imaging of breast: Secondary | ICD-10-CM

## 2013-10-10 ENCOUNTER — Ambulatory Visit (INDEPENDENT_AMBULATORY_CARE_PROVIDER_SITE_OTHER): Payer: BC Managed Care – PPO | Admitting: Internal Medicine

## 2013-10-10 ENCOUNTER — Encounter: Payer: Self-pay | Admitting: Internal Medicine

## 2013-10-10 VITALS — BP 114/76 | HR 81 | Temp 98.0°F | Resp 12 | Ht 67.25 in | Wt 156.0 lb

## 2013-10-10 DIAGNOSIS — E063 Autoimmune thyroiditis: Secondary | ICD-10-CM | POA: Insufficient documentation

## 2013-10-10 DIAGNOSIS — E038 Other specified hypothyroidism: Secondary | ICD-10-CM | POA: Insufficient documentation

## 2013-10-10 MED ORDER — SYNTHROID 100 MCG PO TABS: 100.0000 ug | ORAL_TABLET | Freq: Every day | ORAL | Status: DC

## 2013-10-10 MED ORDER — SYNTHROID 88 MCG PO TABS: 88.0000 ug | ORAL_TABLET | Freq: Every day | ORAL | Status: DC

## 2013-10-10 NOTE — Patient Instructions (Signed)
Please decrease the Synthroid dose to 88 mcg and come back for labs in 6 weeks. Please join MyChart. I will send you the results through there. Please come back for a follow-up appointment in 4 months.

## 2013-10-10 NOTE — Progress Notes (Signed)
Patient ID: Kathleen Serrano, female   DOB: 1961/05/27, 53 y.o.   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 She was followed by Dr Altheimer in the past. I reviewed his most recent notes.  Pt. has been dx with hypothyroidism in 11/2011 - TSH 60. Her TPO Abx were high, at 290 (<34), with negative ATA.   She was started on generic Levothyroxine >> now brand name Synthroid 100 mcg, taken: - fasting - with water - separated by >30 min from b'fast  - no calcium, iron, PPIs, multivitamins   I reviewed pt's latest thyroid tests per records received from PCP: 09/22/2013: TSH 2.248 Lab Results  Component Value Date   TSH 0.205* 09/15/2013  08/05/2013: TSH 0.867, Ab's 51 (<34) >> Synthroid dose increased from 75 to 100 mcg  Pt + feeling lumps in neck, no hoarseness, + occasional dysphagia/no odynophagia, no SOB with lying down.  Pt describes: - extreme fatigue - + palpitations - no more cold intolerance - low appetite - + weight gain - 15 lbs in 1 year - + constipation (new) - no dry skin - no hair falling (hair falling) - + depression - + memory  She has + FH of thyroid disorders in: sister - hypothyroid. No FH of thyroid cancer.  No h/o radiation tx to head or neck. No recent use of iodine supplements.  I reviewed her chart and she also has a history of insomnia and vit D def, fibromylgia  ROS: Constitutional: see HPI Eyes: + blurry vision, no xerophthalmia ENT: no sore throat, + nodules palpated in throat, + occasional dysphagia/no odynophagia, no hoarseness Cardiovascular: no CP/SOB/+ palpitations/no leg swelling Respiratory: no cough/SOB Gastrointestinal: no N/V/D/+ C Musculoskeletal: no muscle/+ joint aches Skin: no rashes Neurological: + tremors/no numbness/tingling/dizziness Psychiatric: no depression/+ anxiety Low libido  Past Medical History  Diagnosis Date  . Hypothyroidism    . SVD (spontaneous vaginal delivery)     x 4  . Skin disorder   . Back pain   . Anxiety    Past Surgical History  Procedure Laterality Date  . Btl    . No past surgeries    . Tubal ligation    . Robotic assisted total hysterectomy  07/29/2012    Procedure: ROBOTIC ASSISTED TOTAL HYSTERECTOMY;  Surgeon: Maeola Sarah. Landry Mellow, MD;  Location: [facility];  Service: Gynecology;  Laterality: Bilateral;  . Bilateral salpingectomy  07/29/2012    Procedure: BILATERAL SALPINGECTOMY;  Surgeon: Maeola Sarah. Landry Mellow, MD;  Location: [facility];  Service: Gynecology;  Laterality: Bilateral;  . Cystoscopy  07/29/2012    Procedure: CYSTOSCOPY;  Surgeon: Maeola Sarah. Landry Mellow, MD;  Location: [facility];  Service: Gynecology;;   History   Social History  . Marital Status: Single, lives with b'friend    Spouse Name: N/A    Number of Children: 4  . Years of Education: 16   Occupational History  . Glass blower/designer    Social History Main Topics  . Smoking status: Former Smoker -- 1.00 packs/day for 25 years    Types: Cigarettes    Quit date: 2011  . Smokeless tobacco: Never Used  . Alcohol Use: Yes     Comment: 1-2 glasses wine/night  . Drug Use: No  . Sexual Activity: Yes    Birth Control/ Protection: None   Social History Narrative   Single, has 4 children   Right handed   17  1 cup per day   Current Outpatient Prescriptions on File Prior to Visit  Medication Sig Dispense Refill  . DULoxetine (CYMBALTA) 60 MG capsule Take 60 mg by mouth daily.      Marland Kitchen estradiol (VIVELLE-DOT) 0.075 MG/24HR Place 1 patch onto the skin 2 (two) times a week. On Mondays and Thursdays      . traZODone (DESYREL) 100 MG tablet Take 100 mg by mouth at bedtime.      Marland Kitchen zolpidem (AMBIEN CR) 12.5 MG CR tablet Take 12.5 mg by mouth at bedtime.      . Multiple Vitamins-Minerals (MULTIVITAMIN WITH MINERALS) tablet Take 1 tablet by mouth daily.      Marland Kitchen OVER THE COUNTER MEDICATION Take 1 tablet by mouth daily. "Homeopathic med" for stool softener       No  current facility-administered medications on file prior to visit.   No Known Allergies Family History  Problem Relation Age of Onset  . Alcoholism    . Arthritis      Joint disorder  . Cancer     PE: BP 114/76  Pulse 81  Temp(Src) 98 F (36.7 C) (Oral)  Resp 12  Ht 5' 7.25" (1.708 m)  Wt 156 lb (70.761 kg)  BMI 24.26 kg/m2  SpO2 98%  LMP 08/01/2012 Wt Readings from Last 3 Encounters:  10/10/13 156 lb (70.761 kg)  09/30/13 155 lb (70.308 kg)  09/15/13 118 lb (53.524 kg)   Constitutional: normal weight, in NAD Eyes: PERRLA, EOMI, no exophthalmos ENT: moist mucous membranes, no thyromegaly, no cervical lymphadenopathy Cardiovascular: RRR, No MRG Respiratory: CTA B Gastrointestinal: abdomen soft, NT, ND, BS+ Musculoskeletal: no deformities, strength intact in all 4 Skin: moist, warm, no rashes Neurological: no tremor with outstretched hands, DTR normal in all 4  ASSESSMENT: 1. Hashimoto's Hypothyroidism  PLAN:  1. Patient with 2 year h/o hypothyroidism, now with slight over-replacement with levothyroxine. She appears euthyroid, but c/o many sxs. We discussed that these can also be caused by menopause, but will need to bring TSH in the normal range first, to see if sxs improve. Her Abs will fluctuate and I do not find benefit from rechecking them.  - We discussed about correct intake of levothyroxine, fasting, with water, separated by at least 30 minutes from breakfast, and separated by more than 4 hours from calcium, iron, multivitamins, acid reflux medications (PPIs). - She does not appear to have a goiter, thyroid nodules, or neck compression symptoms - we'll decrease the Synthroid dose to 88 mcg >> Rx sent - check thyroid tests today: TSH, free T4 in 6 weeks - If these are abnormal, she will need to return in 6-8 weeks for repeat labs - If these are normal, I will see her back in 4 months

## 2013-11-21 ENCOUNTER — Other Ambulatory Visit (INDEPENDENT_AMBULATORY_CARE_PROVIDER_SITE_OTHER): Payer: BC Managed Care – PPO

## 2013-11-21 DIAGNOSIS — E063 Autoimmune thyroiditis: Secondary | ICD-10-CM

## 2013-11-21 LAB — TSH: TSH: 0.23 u[IU]/mL — ABNORMAL LOW (ref 0.35–4.50)

## 2013-11-21 LAB — T4, FREE: FREE T4: 1.06 ng/dL (ref 0.60–1.60)

## 2013-11-25 ENCOUNTER — Other Ambulatory Visit: Payer: Self-pay | Admitting: *Deleted

## 2013-11-25 MED ORDER — SYNTHROID 75 MCG PO TABS: 75.0000 ug | ORAL_TABLET | Freq: Every day | ORAL | Status: DC

## 2013-12-09 ENCOUNTER — Ambulatory Visit: Payer: BC Managed Care – PPO | Admitting: Internal Medicine

## 2014-01-07 ENCOUNTER — Telehealth: Payer: Self-pay | Admitting: Internal Medicine

## 2014-01-07 NOTE — Telephone Encounter (Signed)
OK to repeat TSH and free T4.

## 2014-01-07 NOTE — Telephone Encounter (Signed)
Please read note below. I called pt and she said these symptoms just began. She has felt this way in the past and she is sure that it is her thyroid. Please advise.

## 2014-01-07 NOTE — Telephone Encounter (Signed)
Patient states she is not feeling really well  Shakey, nausea, muscle tremors, backache   Please call patient  She would like to know if she could have labs drawn   Thank You :)

## 2014-01-08 ENCOUNTER — Other Ambulatory Visit: Payer: Self-pay | Admitting: *Deleted

## 2014-01-08 ENCOUNTER — Other Ambulatory Visit (INDEPENDENT_AMBULATORY_CARE_PROVIDER_SITE_OTHER): Payer: BC Managed Care – PPO

## 2014-01-08 DIAGNOSIS — E063 Autoimmune thyroiditis: Secondary | ICD-10-CM

## 2014-01-08 LAB — TSH: TSH: 0.28 u[IU]/mL — ABNORMAL LOW (ref 0.35–4.50)

## 2014-01-08 LAB — T4, FREE: Free T4: 0.97 ng/dL (ref 0.60–1.60)

## 2014-01-08 NOTE — Telephone Encounter (Signed)
Pt showed up this morning to have labs done.

## 2014-01-09 ENCOUNTER — Other Ambulatory Visit: Payer: Self-pay | Admitting: *Deleted

## 2014-01-09 MED ORDER — SYNTHROID 50 MCG PO TABS: 50.0000 ug | ORAL_TABLET | Freq: Every day | ORAL | Status: DC

## 2014-02-09 ENCOUNTER — Ambulatory Visit (INDEPENDENT_AMBULATORY_CARE_PROVIDER_SITE_OTHER): Payer: BC Managed Care – PPO | Admitting: Internal Medicine

## 2014-02-09 ENCOUNTER — Encounter: Payer: Self-pay | Admitting: Internal Medicine

## 2014-02-09 VITALS — BP 122/76 | HR 73 | Temp 98.8°F | Resp 12 | Wt 158.0 lb

## 2014-02-09 DIAGNOSIS — E063 Autoimmune thyroiditis: Secondary | ICD-10-CM

## 2014-02-09 LAB — TSH: TSH: 1.78 u[IU]/mL (ref 0.35–4.50)

## 2014-02-09 LAB — T3, FREE: T3 FREE: 2.3 pg/mL (ref 2.3–4.2)

## 2014-02-09 LAB — T4, FREE: Free T4: 1.01 ng/dL (ref 0.60–1.60)

## 2014-02-09 NOTE — Progress Notes (Signed)
Patient ID: Kathleen Serrano, female   DOB: 17-Mar-1961, 53 y.o.   MRN: [id number]   HPI  Kathleen Serrano is a 53 y.o.-year-old female, returning for f/u for for Hashimoto's hypothyroidism. She was followed by Dr Altheimer in the past. Last visit with me: 4 mo ago.   Reviewed hx: Pt. has been dx with hypothyroidism in 11/2011 - TSH 60. TPO Abx were high, at 290 (<34), with negative ATA.   She was started on generic Levothyroxine >> now brand name Synthroid, taken: - fasting - with water - separated by >30 min from b'fast  - no calcium, iron, PPIs, multivitamins   I reviewed pt's latest thyroid tests per records received from PCP:  Lab Results  Component Value Date   TSH 0.28* 01/08/2014   TSH 0.23* 11/21/2013   TSH 0.205* 09/15/2013   FREET4 0.97 01/08/2014   FREET4 1.06 11/21/2013  09/22/2013: TSH 2.248  08/05/2013: TSH 0.867, Ab's 51 (<34) >> Synthroid dose increased from 75 to 100 mcg  At last visit, we decreased the Synthroid to 88 mcg, but a repeat TSH was still suppressed, so I advised her to decrease to 50 mcg. She is now taking this dose.  Pt does not feel lumps in neck, no hoarseness, no dysphagia/no odynophagia, no SOB with lying down.  Pt describes: - improved fatigue - no more palpitations - no more cold intolerance - low appetite - + weight gain - 2 lbs (going to the gym) - no more  constipation - no dry skin - no hair falling (hair falling) - + situational depression - + still brain fog  I reviewed her chart and she also has a history of insomnia and vit D def, fibromyalgia.  ROS: Constitutional: see HPI Eyes: no blurry vision, no xerophthalmia ENT: no sore throat, no  nodules palpated in throat, + occasional dysphagia/no odynophagia, no hoarseness Cardiovascular: no CP/SOB/palpitations/no leg swelling Respiratory: no cough/SOB Gastrointestinal: no N/V/D/C Musculoskeletal: no muscle/joint aches Skin: no rashes Neurological: no tremors/no  numbness/tingling/dizziness  PE: BP 122/76  Pulse 73  Temp(Src) 98.8 F (37.1 C) (Oral)  Resp 12  Wt 158 lb (71.668 kg)  SpO2 97%  LMP 08/01/2012 Wt Readings from Last 3 Encounters:  02/09/14 158 lb (71.668 kg)  10/10/13 156 lb (70.761 kg)  09/30/13 155 lb (70.308 kg)   Constitutional: normal weight, in NAD Eyes: PERRLA, EOMI, no exophthalmos ENT: moist mucous membranes, no thyromegaly, no cervical lymphadenopathy Cardiovascular: RRR, No MRG Respiratory: CTA B Gastrointestinal: abdomen soft, NT, ND, BS+ Musculoskeletal: no deformities, strength intact in all 4 Skin: moist, warm, no rashes Neurological: no tremor with outstretched hands, DTR normal in all 4  ASSESSMENT: 1. Hashimoto's Hypothyroidism  PLAN:  1. Patient with 2.5 year h/o hypothyroidism, now with slight over-replacement with levothyroxine. She appears euthyroid. We again discussed that these can also be caused by menopause, but will need to bring TSH in the normal range first, to see if sxs improve.  - She is taking the levothyroxine correctly: fasting, with water, separated by at least 30 minutes from breakfast, and separated by more than 4 hours from calcium, iron, multivitamins, acid reflux medications (PPIs). - She does not appear to have a goiter, thyroid nodules, or neck compression symptoms - continue Synthroid 50 mcg for now - check thyroid tests today: TSH, free T4  - If these are abnormal, she will need to return in 6-8 weeks for repeat labs - If these are normal, I will see her back in 6 months (she  will let me know if she needs to have the TFTs checked before our next visit in 6 mo)  Office Visit on 02/09/2014  Component Date Value Ref Range Status  . TSH 02/09/2014 1.78  0.35 - 4.50 uIU/mL Final  . Free T4 02/09/2014 1.01  0.60 - 1.60 ng/dL Final  . T3, Free 02/09/2014 2.3  2.3 - 4.2 pg/mL Final   Will recheck labs in 3 mo per pt's request: Msg sent: Dear [name], The labs are great. Please  stay on the current Synthroid dose. Let's recheck the tests in 3 months. I will order them. Sincerely, Philemon Kingdom MD

## 2014-02-09 NOTE — Patient Instructions (Signed)
Please stop at the lab.  Please come back for a follow-up appointment in 6 months.  

## 2014-04-19 ENCOUNTER — Other Ambulatory Visit: Payer: Self-pay | Admitting: Internal Medicine

## 2014-05-19 ENCOUNTER — Other Ambulatory Visit: Payer: BC Managed Care – PPO

## 2014-05-25 ENCOUNTER — Emergency Department (HOSPITAL_COMMUNITY)
Admission: EM | Admit: 2014-05-25 | Discharge: 2014-05-25 | Disposition: A | Discharge status: Home / Self Care | Payer: BC Managed Care – PPO | Attending: Emergency Medicine | Admitting: Emergency Medicine

## 2014-05-25 ENCOUNTER — Other Ambulatory Visit: Payer: BC Managed Care – PPO

## 2014-05-25 ENCOUNTER — Other Ambulatory Visit: Payer: Self-pay

## 2014-05-25 ENCOUNTER — Encounter (HOSPITAL_COMMUNITY): Payer: Self-pay | Admitting: Emergency Medicine

## 2014-05-25 DIAGNOSIS — Z87891 Personal history of nicotine dependence: Secondary | ICD-10-CM | POA: Diagnosis not present

## 2014-05-25 DIAGNOSIS — Z872 Personal history of diseases of the skin and subcutaneous tissue: Secondary | ICD-10-CM | POA: Diagnosis not present

## 2014-05-25 DIAGNOSIS — H8110 Benign paroxysmal vertigo, unspecified ear: Secondary | ICD-10-CM | POA: Insufficient documentation

## 2014-05-25 DIAGNOSIS — E039 Hypothyroidism, unspecified: Secondary | ICD-10-CM | POA: Diagnosis not present

## 2014-05-25 DIAGNOSIS — R42 Dizziness and giddiness: Secondary | ICD-10-CM | POA: Diagnosis present

## 2014-05-25 DIAGNOSIS — Z79899 Other long term (current) drug therapy: Secondary | ICD-10-CM | POA: Diagnosis not present

## 2014-05-25 DIAGNOSIS — F419 Anxiety disorder, unspecified: Secondary | ICD-10-CM | POA: Insufficient documentation

## 2014-05-25 LAB — CBC WITH DIFFERENTIAL/PLATELET
Basophils Absolute: 0 10*3/uL (ref 0.0–0.1)
Basophils Relative: 0 % (ref 0–1)
EOS ABS: 0.1 10*3/uL (ref 0.0–0.7)
EOS PCT: 1 % (ref 0–5)
HCT: 40.5 % (ref 36.0–46.0)
Hemoglobin: 14.2 g/dL (ref 12.0–15.0)
LYMPHS ABS: 4 10*3/uL (ref 0.7–4.0)
Lymphocytes Relative: 37 % (ref 12–46)
MCH: 30.9 pg (ref 26.0–34.0)
MCHC: 35.1 g/dL (ref 30.0–36.0)
MCV: 88 fL (ref 78.0–100.0)
MONO ABS: 0.6 10*3/uL (ref 0.1–1.0)
Monocytes Relative: 6 % (ref 3–12)
Neutro Abs: 6.2 10*3/uL (ref 1.7–7.7)
Neutrophils Relative %: 56 % (ref 43–77)
PLATELETS: 295 10*3/uL (ref 150–400)
RBC: 4.6 MIL/uL (ref 3.87–5.11)
RDW: 12.5 % (ref 11.5–15.5)
WBC: 11 10*3/uL — ABNORMAL HIGH (ref 4.0–10.5)

## 2014-05-25 LAB — COMPREHENSIVE METABOLIC PANEL
ALT: 17 U/L (ref 0–35)
ANION GAP: 20 — AB (ref 5–15)
AST: 19 U/L (ref 0–37)
Albumin: 3.9 g/dL (ref 3.5–5.2)
Alkaline Phosphatase: 81 U/L (ref 39–117)
BUN: 13 mg/dL (ref 6–23)
CALCIUM: 10.1 mg/dL (ref 8.4–10.5)
CO2: 19 mEq/L (ref 19–32)
CREATININE: 0.81 mg/dL (ref 0.50–1.10)
Chloride: 98 mEq/L (ref 96–112)
GFR calc non Af Amer: 82 mL/min — ABNORMAL LOW (ref >90)
GLUCOSE: 96 mg/dL (ref 70–99)
Potassium: 3.7 mEq/L (ref 3.7–5.3)
SODIUM: 137 meq/L (ref 137–147)
TOTAL PROTEIN: 7.6 g/dL (ref 6.0–8.3)
Total Bilirubin: 0.3 mg/dL (ref 0.3–1.2)

## 2014-05-25 MED ORDER — SODIUM CHLORIDE 0.9 % IV BOLUS (SEPSIS)
1000.0000 mL | Freq: Once | INTRAVENOUS | Status: AC
  Administered 2014-05-25: 1000 mL via INTRAVENOUS

## 2014-05-25 MED ORDER — MECLIZINE HCL 25 MG PO TABS: 25.0000 mg | ORAL_TABLET | Freq: Three times a day (TID) | ORAL | Status: DC | PRN

## 2014-05-25 MED ORDER — MECLIZINE HCL 25 MG PO TABS
25.0000 mg | ORAL_TABLET | Freq: Once | ORAL | Status: AC
  Administered 2014-05-25: 25 mg via ORAL
  Filled 2014-05-25: qty 1

## 2014-05-25 NOTE — ED Notes (Signed)
Pt. reports dizziness / anxiety attack onset this afternoon , denies fever or chills , respirations unlabored .

## 2014-05-25 NOTE — ED Notes (Signed)
Pt states that she had 6 upper teeth pulled out on Wednesday. States that she also weaned herself off of Cymbalta over the past 2 weeks. States that she has been completely off of it for 4 days. States that prior to going to the gym she had an episode where her head turned but she felt like her eyes did not follow. States that during her workout she was light headed and dizzy. States that she went home and had a few sips of wine and the light headedness continued.

## 2014-05-25 NOTE — Discharge Instructions (Signed)
If you were given medicines take as directed.  If you are on coumadin or contraceptives realize their levels and effectiveness is altered by many different medicines.  If you have any reaction (rash, tongues swelling, other) to the medicines stop taking and see a physician.   Keep log of blood pressure. Please follow up as directed and return to the ER or see a physician for new or worsening symptoms (especially unilateral weakness, problem walking, chest pain, shortness of breath).  Stay hydrated.  Thank you. Filed Vitals:   05/25/14 1934  BP: 165/77  Pulse: 60  Temp: 98 F (36.7 C)  Resp: 22  SpO2: 100%  BP improved to 110 in the room systolic on recheck.   Dizziness  Dizziness means you feel unsteady or lightheaded. You might feel like you are going to pass out (faint). HOME CARE   Drink enough fluids to keep your pee (urine) clear or pale yellow.  Take your medicines exactly as told by your doctor. If you take blood pressure medicine, always stand up slowly from the lying or sitting position. Hold on to something to steady yourself.  If you need to stand in one place for a long time, move your legs often. Tighten and relax your leg muscles.  Have someone stay with you until you feel okay.  Do not drive or use heavy machinery if you feel dizzy.  Do not drink alcohol. GET HELP RIGHT AWAY IF:   You feel dizzy or lightheaded and it gets worse.  You feel sick to your stomach (nauseous), or you throw up (vomit).  You have trouble talking or walking.  You feel weak or have trouble using your arms, hands, or legs.  You cannot think clearly or have trouble forming sentences.  You have chest pain, belly (abdominal) pain, sweating, or you are short of breath.  Your vision changes.  You are bleeding.  You have problems from your medicine that seem to be getting worse. MAKE SURE YOU:   Understand these instructions.  Will watch your condition.  Will get help right away if  you are not doing well or get worse. Document Released: 06/22/2011 Document Revised: 09/25/2011 Document Reviewed: 06/22/2011 [facility] Patient Information 2015 [place], [place]. This information is not intended to replace advice given to you by your health care provider. Make sure you discuss any questions you have with your health care provider.

## 2014-05-25 NOTE — ED Notes (Signed)
Pt A&OX4, ambulatory at d/c with steady gait, NAD 

## 2014-05-25 NOTE — ED Provider Notes (Signed)
CSN: [id number]     Arrival date & time 05/25/14  1928 History   First MD Initiated Contact with Patient 05/25/14 2015     Chief Complaint  Patient presents with  . Dizziness     (Consider location/radiation/quality/duration/timing/severity/associated sxs/prior Treatment) HPI Comments: 53 year old female with fibroids, thyroiditis, fibromyalgia presents with intermittent lightheadedness worse with sitting up or turning her head. Patient also an episode just while sitting very brief and severe lasting 3 or 4 seconds and then minimal symptoms in between. No history of similar. No stroke history or cardiac history. No recent surgeries or blood clot history, no shortness of breath or chest pain, no syncope. Patient had no chest pain or significant symptoms with exertion while exercising today.  Patient is a 53 y.o. female presenting with dizziness. The history is provided by the patient.  Dizziness Associated symptoms: no chest pain, no headaches, no shortness of breath and no vomiting     Past Medical History  Diagnosis Date  . Hypothyroidism   . SVD (spontaneous vaginal delivery)     x 4  . Skin disorder   . Back pain   . Anxiety    Past Surgical History  Procedure Laterality Date  . Btl    . No past surgeries    . Tubal ligation    . Robotic assisted total hysterectomy  07/29/2012    Procedure: ROBOTIC ASSISTED TOTAL HYSTERECTOMY;  Surgeon: Maeola Sarah. Landry Mellow, MD;  Location: [facility];  Service: Gynecology;  Laterality: Bilateral;  . Bilateral salpingectomy  07/29/2012    Procedure: BILATERAL SALPINGECTOMY;  Surgeon: Maeola Sarah. Landry Mellow, MD;  Location: [facility];  Service: Gynecology;  Laterality: Bilateral;  . Cystoscopy  07/29/2012    Procedure: CYSTOSCOPY;  Surgeon: Maeola Sarah. Landry Mellow, MD;  Location: [facility];  Service: Gynecology;;   Family History  Problem Relation Age of Onset  . Alcoholism    . Arthritis      Joint disorder  . Cancer     History  Substance Use Topics  . Smoking status: Former  Smoker -- 1.00 packs/day for 25 years    Types: Cigarettes    Quit date: 07/24/2011  . Smokeless tobacco: Never Used  . Alcohol Use: Yes     Comment: occasonal     OB History    No data available     Review of Systems  Constitutional: Negative for fever and chills.  HENT: Negative for congestion.   Eyes: Negative for visual disturbance.  Respiratory: Negative for shortness of breath.   Cardiovascular: Negative for chest pain.  Gastrointestinal: Negative for vomiting and abdominal pain.  Genitourinary: Negative for dysuria and flank pain.  Musculoskeletal: Negative for back pain, neck pain and neck stiffness.  Skin: Negative for rash.  Neurological: Positive for dizziness and light-headedness. Negative for headaches.      Allergies  Review of patient's allergies indicates no known allergies.  Home Medications   Prior to Admission medications   Medication Sig Start Date End Date Taking? Authorizing Provider  DULoxetine (CYMBALTA) 60 MG capsule Take 60 mg by mouth daily.   Yes Historical Provider, MD  estradiol (VIVELLE-DOT) 0.075 MG/24HR Place 1 patch onto the skin 2 (two) times a week. On Mondays and Thursdays   Yes Historical Provider, MD  Multiple Vitamins-Minerals (MULTIVITAMIN WITH MINERALS) tablet Take 1 tablet by mouth daily.   Yes Historical Provider, MD  OVER THE COUNTER MEDICATION Take 1 tablet by mouth daily. "Homeopathic med" for stool softener   Yes Historical Provider,  MD  SYNTHROID 50 MCG tablet TAKE 1 TABLET (50 MCG TOTAL) BY MOUTH DAILY BEFORE BREAKFAST. 04/20/14  Yes Philemon Kingdom, MD  traZODone (DESYREL) 100 MG tablet Take 100 mg by mouth at bedtime.   Yes Historical Provider, MD  zolpidem (AMBIEN CR) 12.5 MG CR tablet Take 12.5 mg by mouth at bedtime.   Yes Historical Provider, MD  meclizine (ANTIVERT) 25 MG tablet Take 1 tablet (25 mg total) by mouth 3 (three) times daily as needed for dizziness. 05/25/14   Mariea Clonts, MD   BP 127/65 mmHg  Pulse 67   Temp(Src) 98 F (36.7 C)  Resp 11  SpO2 100%  LMP 08/01/2012 Physical Exam  Constitutional: She is oriented to person, place, and time. She appears well-developed and well-nourished.  HENT:  Head: Normocephalic and atraumatic.  Eyes: Conjunctivae are normal. Right eye exhibits no discharge. Left eye exhibits no discharge.  Neck: Normal range of motion. Neck supple. No tracheal deviation present.  Cardiovascular: Normal rate, regular rhythm and intact distal pulses.   No murmur heard. Pulmonary/Chest: Effort normal and breath sounds normal.  Abdominal: Soft. She exhibits no distension. There is no tenderness. There is no guarding.  Musculoskeletal: She exhibits no edema.  Neurological: She is alert and oriented to person, place, and time. No cranial nerve deficit. Coordination normal. GCS eye subscore is 4. GCS verbal subscore is 5. GCS motor subscore is 6.  5+ strength in UE and LE with f/e at major joints. Sensation to palpation intact in UE and LE. CNs 2-12 grossly intact.  EOMFI.  PERRL.   Finger nose and coordination intact bilateral.   Visual fields intact to finger testing.   Skin: Skin is warm. No rash noted.  Psychiatric: She has a normal mood and affect.  Nursing note and vitals reviewed.   ED Course  Procedures (including critical care time)   EMERGENCY DEPARTMENT [place] CARDIAC EXAM "Study: Limited Ultrasound of the heart and pericardium"  INDICATIONS:lightheaded Multiple views of the heart and pericardium were obtained in real-time with a multi-frequency probe.  PERFORMED SN:[id number]  IMAGES ARCHIVED?: Yes  FINDINGS: No pericardial effusion, Normal contractility and Tamponade physiology absent  LIMITATIONS:  Body habitus  VIEWS USED: Parasternal long axis, Parasternal short axis and Apical 4 chamber   INTERPRETATION: Cardiac activity present, Pericardial effusioin absent, Cardiac tamponade absent and Normal contractility   Labs Review Labs Reviewed  CBC WITH  DIFFERENTIAL - Abnormal; Notable for the following:    WBC 11.0 (*)    All other components within normal limits  COMPREHENSIVE METABOLIC PANEL - Abnormal; Notable for the following:    GFR calc non Af Amer 82 (*)    Anion gap 20 (*)    All other components within normal limits    Imaging Review No results found.   EKG Interpretation None     EKG reviewed heart rate 61, normal QT, normal axis, no acute ST findings, no delta waves seen. MDM   Final diagnoses:  Lightheadedness  BPPV (benign paroxysmal positional vertigo), unspecified laterality   Healthy patient with intermittent lightheaded/dizziness symptoms. I was able to reproduce the symptoms with sitting up discussed differential likely orthostatic versus benign positional vertigo. Normal neuro exam otherwise. Patient feels it's mostly intermittent lightheadedness, EKG performed and basic blood work, no anemia, mild dehydration clinically IV fluid bolus given for symptom control and meclizine.Patient denies recent surgery or classic blood clot risk factors.  Bedside ultrasound no acute findings, patient understands limited exam. Patient symptoms intermittent in  the ER,, EKG unremarkable, blood work unremarkable, vitals mild high blood pressure for which patient follow-up outpatient for. Patient requesting to go home and follow-up outpatient.  Results and differential diagnosis were discussed with the patient/parent/guardian. Close follow up outpatient was discussed, comfortable with the plan.   Medications  meclizine (ANTIVERT) tablet 25 mg (25 mg Oral Given 05/25/14 2059)  sodium chloride 0.9 % bolus 1,000 mL (0 mLs Intravenous Stopped 05/25/14 2137)    Filed Vitals:   05/25/14 2100 05/25/14 2115 05/25/14 2130 05/25/14 2145  BP: 143/85 141/74 112/65 127/65  Pulse: 70 93 71 67  Temp:      Resp: 10 26 19 11   SpO2: 100% 100% 100% 100%    Final diagnoses:  Lightheadedness  BPPV (benign paroxysmal positional vertigo),  unspecified laterality        Mariea Clonts, MD 05/25/14 2154

## 2014-06-29 ENCOUNTER — Other Ambulatory Visit: Payer: Self-pay | Admitting: Internal Medicine

## 2014-08-13 ENCOUNTER — Encounter: Payer: Self-pay | Admitting: Internal Medicine

## 2014-08-13 ENCOUNTER — Ambulatory Visit (INDEPENDENT_AMBULATORY_CARE_PROVIDER_SITE_OTHER): Payer: BC Managed Care – PPO | Admitting: Internal Medicine

## 2014-08-13 DIAGNOSIS — E063 Autoimmune thyroiditis: Secondary | ICD-10-CM

## 2014-08-13 LAB — T4, FREE: Free T4: 1.01 ng/dL (ref 0.60–1.60)

## 2014-08-13 LAB — TSH: TSH: 1.08 u[IU]/mL (ref 0.35–4.50)

## 2014-08-13 NOTE — Patient Instructions (Signed)
Please stop at the lab.  Please come back for a follow-up appointment in 6 months.  

## 2014-08-13 NOTE — Progress Notes (Signed)
Patient ID: Kathleen Serrano, female   DOB: 06/08/1961, 54 y.o.   MRN: [id number]   HPI  Kathleen Serrano is a 54 y.o.-year-old female, returning for f/u for for Hashimoto's hypothyroidism. Last visit 6 mo ago.  Since last visit she had Sx (R shoulder 07/13/2014).   She had vertigo in 05/2014.   Reviewed hx: Pt. has been dx with hypothyroidism in 11/2011 - TSH 60. TPO Abx were high, at 290 (<34), with negative ATA.   She was started on generic Levothyroxine >> now brand name Synthroid, taken: - fasting - with water - separated by >30 min from b'fast  - no calcium, iron, PPIs, multivitamins   At previous visits, we decreased the Synthroid to 88 mcg, but a repeat TSH was still suppressed, so I advised her to decrease to 50 mcg. She is now taking this dose. Last set of TFTs normal:  Lab Results  Component Value Date   TSH 1.78 02/09/2014   TSH 0.28* 01/08/2014   TSH 0.23* 11/21/2013   TSH 0.205* 09/15/2013   FREET4 1.01 02/09/2014   FREET4 0.97 01/08/2014   FREET4 1.06 11/21/2013  09/22/2013: TSH 2.248 08/05/2013: TSH 0.867, Ab's 51 (<34) >> Synthroid dose increased from 75 to 100 mcg  Pt does not feel lumps in neck, no hoarseness, no dysphagia/no odynophagia, no SOB with lying down.  Pt describes: - improved fatigue - no more palpitations - + heat intolerance - low appetite - no weight gain - no more  constipation - no dry skin - no hair loss  I reviewed her chart and she also has a history of insomnia and vit D def, fibromyalgia, psoriasis. Hysterectomy 2013. On HRT.  ROS: Constitutional: see HPI Eyes: no blurry vision, no xerophthalmia ENT: + sore throat, no  nodules palpated in throat, + occasional dysphagia/no odynophagia, no hoarseness Cardiovascular: no CP/SOB/palpitations/no leg swelling Respiratory: no cough/SOB Gastrointestinal: no N/V/D/C Musculoskeletal: + muscle/no joint aches Skin: no rashes, + easy bruising Neurological: no tremors/no numbness/tingling/dizziness +  low libido  I reviewed pt's medications, allergies, PMH, social hx, family hx, and changes were documented in the history of present illness. Otherwise, unchanged from my initial visit note.  PE: LMP 08/01/2012 Wt Readings from Last 3 Encounters:  02/09/14 158 lb (71.668 kg)  10/10/13 156 lb (70.761 kg)  09/30/13 155 lb (70.308 kg)   Constitutional: normal weight, in NAD Eyes: PERRLA, EOMI, no exophthalmos ENT: moist mucous membranes, + mild symmetric thyromegaly, no cervical lymphadenopathy Cardiovascular: RRR, No MRG Respiratory: CTA B Gastrointestinal: abdomen soft, NT, ND, BS+ Musculoskeletal: no deformities, strength intact in all 4 Skin: moist, warm, no rashes Neurological: no tremor with outstretched hands, DTR normal in all 4  ASSESSMENT: 1. Hashimoto's Hypothyroidism  PLAN:  1. Patient with 3 year h/o hypothyroidism, now with normal TFTs. She appears euthyroid.   - She is taking the levothyroxine correctly: fasting, with water, separated by at least 30 minutes from breakfast, and separated by more than 4 hours from calcium, iron, multivitamins, acid reflux medications (PPIs). - She does not appear to have a goiter, thyroid nodules, or neck compression symptoms - continue Synthroid 50 mcg for now - check thyroid tests today: TSH, free T4  - If these are abnormal, she will need to return in 6 weeks for repeat labs - If these are normal, I will see her back in 6 months   Office Visit on 08/13/2014  Component Date Value Ref Range Status  . Free T4 08/13/2014 1.01  0.60 -  1.60 ng/dL Final  . TSH 08/13/2014 1.08  0.35 - 4.50 uIU/mL Final  Labs normal >> continue LT4 50 mcg daily.

## 2014-09-30 ENCOUNTER — Other Ambulatory Visit: Payer: Self-pay | Admitting: Internal Medicine

## 2014-10-13 ENCOUNTER — Ambulatory Visit: Payer: 59

## 2014-10-27 ENCOUNTER — Encounter: Payer: Self-pay | Admitting: Podiatry

## 2014-10-27 ENCOUNTER — Ambulatory Visit (INDEPENDENT_AMBULATORY_CARE_PROVIDER_SITE_OTHER): Payer: 59

## 2014-10-27 ENCOUNTER — Ambulatory Visit: Payer: 59

## 2014-10-27 ENCOUNTER — Ambulatory Visit (INDEPENDENT_AMBULATORY_CARE_PROVIDER_SITE_OTHER): Payer: 59 | Admitting: Podiatry

## 2014-10-27 VITALS — Ht 67.0 in | Wt 160.0 lb

## 2014-10-27 DIAGNOSIS — M79672 Pain in left foot: Secondary | ICD-10-CM

## 2014-10-27 DIAGNOSIS — M205X2 Other deformities of toe(s) (acquired), left foot: Secondary | ICD-10-CM | POA: Diagnosis not present

## 2014-10-27 DIAGNOSIS — D3610 Benign neoplasm of peripheral nerves and autonomic nervous system, unspecified: Secondary | ICD-10-CM

## 2014-10-27 NOTE — Progress Notes (Signed)
   Subjective:    Patient ID: Kathleen Serrano, female    DOB: 02/18/1961, 54 y.o.   MRN: [id number]  HPI Comments: Pt complains of pain in the left dorsal 1st MPJ from a spur and has worsened over couple of years.  Pt complains of burning sensation in the 4th interspace left foot.  Foot Pain      Review of Systems  All other systems reviewed and are negative.      Objective:   Physical Exam        Assessment & Plan:

## 2014-10-27 NOTE — Progress Notes (Signed)
Subjective:     Patient ID: Kathleen Serrano, female   DOB: 1961-02-25, 54 y.o.   MRN: [id number]  HPI patient presents with painful big toe joint left and pain between the third and fourth left with shooting radiating discomfort into the adjacent digits. States it's been going on for a couple of years and has gradually worsened over the last few months   Review of Systems  All other systems reviewed and are negative.      Objective:   Physical Exam  Constitutional: She is oriented to person, place, and time.  Cardiovascular: Intact distal pulses.   Musculoskeletal: Normal range of motion.  Neurological: She is oriented to person, place, and time.  Skin: Skin is warm.  Nursing note and vitals reviewed.  neurovascular status found to be intact with muscle strength adequate range of motion within normal limits. Patient's noted to have good digital perfusion is well oriented 3 with moderate depression of the arch and does have limitation of motion first MPJ left with inflammation and pain mostly on the lateral side. There is shooting discomforts between the third and fourth toe left foot and a positive nodule when I pressed in to the interspace and squeeze the foot. 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 Spent a great of time educating her on the deformity of the big toe joint and third interspace and reviewed treatment options including conservative injection treatment and shoe gear modification versus surgery. She is going to talk to her Kathleen Serrano but due to the intensity of her discomfort and failure to respond other conservative treatments she is  leaning towards surgical osteotomy first metatarsal left and neuroma excision left. She will reappoint in 2 weeks where we will make final decision as what we'll be best for her

## 2014-10-29 ENCOUNTER — Telehealth: Payer: Self-pay | Admitting: Podiatry

## 2014-10-29 NOTE — Telephone Encounter (Signed)
PT CALLED AND DID NOT WANT TO LEAVE A VM BECAUSE SHE HAS ALREADY DONE SO. SHE WANTED TO GET SCHEDULED FOR SURGERY.PLEASE CALL HER BACK.

## 2014-11-02 NOTE — Telephone Encounter (Signed)
10/28/2013  11am   "Trying to schedule surgery time actually possibly next week if we could.  Please let me know as soon as possible so I can work it out with my doctors."   11/02/14  "I would like to schedule surgery.  I been calling for the past 5 days."  When would you like to schedule?  He does not have anything available for the next 2 weeks.  "I wish someone had told me that before.  How about you tell me what he has available.  Why don't we go that route."  His next available date is 11/17/2014.  "Okay, put me down for that date.  What time will it be?"  It will be sometime that morning.  I can't give you an exact time.  Surgical center normally calls a day or 2 prior to and will give you the time.  Have you signed consent forms?  "No, I don't think I've done anything."  Okay, you will need to schedule an appointment for a consultation.  I will transfer you to a scheduler.

## 2014-11-09 ENCOUNTER — Telehealth: Payer: Self-pay | Admitting: *Deleted

## 2014-11-09 NOTE — Telephone Encounter (Signed)
I scheduled surgery the other day for 11/17/2014.  I need to cancel that.  I will call back next week to reschedule that.  Sorry for any inconvenience.  I attempted to call patient at work.  I was informed that she is on medical leave for a month.  I called patient on her mobile phone.  I got your message about rescheduling your surgery.  "Actually, I was going to call you this morning.  I want to keep it for 11/17/2014 if I can.  Is that possible?"  Yes, that day is still available.  "Okay, I'm scheduled to come in there tomorrow.  What time is it?"  You're scheduled for tomorrow at 4:00pm.  "Okay, thank you."

## 2014-11-10 ENCOUNTER — Ambulatory Visit (INDEPENDENT_AMBULATORY_CARE_PROVIDER_SITE_OTHER): Payer: 59 | Admitting: Podiatry

## 2014-11-10 ENCOUNTER — Telehealth: Payer: Self-pay | Admitting: *Deleted

## 2014-11-10 ENCOUNTER — Encounter: Payer: Self-pay | Admitting: Podiatry

## 2014-11-10 VITALS — BP 116/75 | HR 74 | Resp 10

## 2014-11-10 DIAGNOSIS — D3610 Benign neoplasm of peripheral nerves and autonomic nervous system, unspecified: Secondary | ICD-10-CM

## 2014-11-10 DIAGNOSIS — M205X2 Other deformities of toe(s) (acquired), left foot: Secondary | ICD-10-CM

## 2014-11-10 NOTE — Telephone Encounter (Signed)
I spoke with Apolonio Schneiders at [facility].  She stated patient's insurance is a term insurance and will expire this week.  If the surgery is authorized it may have to be done again next week.  I attempted to call Lattie Haw regarding insurance.  I left messages for her to call me tomorrow.

## 2014-11-10 NOTE — Progress Notes (Signed)
Subjective:     Patient ID: Kathleen Serrano, female   DOB: 1960/10/18, 54 y.o.   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 Explained all complications as listed and reviewed the fact that ultimately she may require fusion or joint implantation of the big toe joint. Patient wants surgery and signs consent form is given all preoperative instructions and is dispensed air fracture walker with instructions on usage. Patient understands total recovery. Can take 6 months to one year for this condition

## 2014-11-11 NOTE — Telephone Encounter (Addendum)
I called patient and informed her that I was informed that insurance term expires at the end of this month.  "My insurance comes out of my account automatically each month.  It's good until December.  I promise you, it is good."  Okay, thank you.  I will proceed with the authorization.  I called and spoke to [place] in regards to getting authorization for surgery.  Bunion procedure requires authorization, Neurectomy procedure does not.  They will contact me with decision about authorization.  Reference number is  [id number].

## 2014-11-13 NOTE — Telephone Encounter (Signed)
I called to check on the status of authorization for outpatient surgery.  "It's still pending."  Is there anything I need to do?  I haven't received anything requesting any further steps.  "You can fax notes if you like.  Send to [phone], attention clinical review, notification number and patient's name."  Okay, I'll send it over.  "I see a note where you were sending chart notes.  Can you send them directly to me and I'll work on processing it[employer] your fax number?  "Send them to [phone]."  I faxed requested chart notes to be reviewed.

## 2014-11-16 NOTE — Telephone Encounter (Signed)
Surgery was authorized by [facility].  Surgery was deemed medically necessary, effective from 11/17/2014 - 12/17/2014.  Reference number is [id number].  I sent authorization to [facility].

## 2014-11-17 ENCOUNTER — Encounter: Payer: Self-pay | Admitting: Podiatry

## 2014-11-17 DIAGNOSIS — M2012 Hallux valgus (acquired), left foot: Secondary | ICD-10-CM | POA: Diagnosis not present

## 2014-11-17 DIAGNOSIS — G5760 Lesion of plantar nerve, unspecified lower limb: Secondary | ICD-10-CM | POA: Diagnosis not present

## 2014-11-19 ENCOUNTER — Telehealth: Payer: Self-pay | Admitting: *Deleted

## 2014-11-19 NOTE — Telephone Encounter (Signed)
Pt states she is in severe pain after surgery on 11/17/2014.  Pt states she has a throbbing pain with occasional sharp pain especially with weight bearing.  I told pt to not be on her feet anymore then 5 minutes/hour the 1st week post-op.  I instructed pt to remove the boot, open ended sock and ace wrap only and elevate the foot for 15 minutes, icing during the elevation period, then lower foot to level and rewrap ace looser, if pain worsens elevated then dangle for 15 minutes, after place foot level and rewrap foot.  Pt states she has already removed the ace, and has increased the Demerol to 2 tablets every 4 hours.  I told the pt the anesthetic has worn off and the current symptoms will decrease in the next 2 days or so, to add Ibuprofen 200mg  in between the Demerol, up to 8 Ibuprofen pere day.  Pt agreed and I encouraged her to call with concerns.

## 2014-11-23 ENCOUNTER — Ambulatory Visit (INDEPENDENT_AMBULATORY_CARE_PROVIDER_SITE_OTHER): Payer: 59

## 2014-11-23 ENCOUNTER — Ambulatory Visit (INDEPENDENT_AMBULATORY_CARE_PROVIDER_SITE_OTHER): Payer: 59 | Admitting: Podiatry

## 2014-11-23 ENCOUNTER — Encounter: Payer: Self-pay | Admitting: *Deleted

## 2014-11-23 VITALS — BP 139/85 | HR 87 | Resp 12

## 2014-11-23 DIAGNOSIS — Z9889 Other specified postprocedural states: Secondary | ICD-10-CM

## 2014-11-23 NOTE — Progress Notes (Signed)
   Subjective:    Patient ID: Kathleen Serrano, female    DOB: 06-24-1961, 54 y.o.   MRN: [id number]  HPI Comments: DOS 11/17/2014 left austin bunionectomy, and 3rd interspace neurectomy Ms. 1, 54 year old female, presents the office today postop visit #1 status post left foot surgery. She states that this time she does not longer want to wear the Cam Walker and is adamantly requesting a surgical shoe. She states of the Cam Gilford Rile is heavy. She states that her pain is currently controlled. She denies any systemic complaints such as fevers, chills, nausea, vomiting. No other complaints at this time in no acute changes since last appointment. Denies any calf pain, chest pain, shortness of breath.    Review of Systems  All other systems reviewed and are negative.      Objective:   Physical Exam AAO x3, NAD DP/PT pulses palpable bilaterally, CRT less than 3 seconds Protective sensation intact with Simms Weinstein monofilament Incisional the dorsal medial aspect of the left foot as well as the third interspace as well coapted without any evidence of dehiscence and sutures are intact. There is mild edema overlying the surgical site. There is slight erythema around the bunion incision however patient states that she has gone back to work today is been on her feet quite a bit and his been more swollen than when it has been previously. There is mild edema overlying the surgical site. There is no increase in warmth and there is no ascending synovitis. There is no drainage from the incision and no malodor. There is no tenderness palpation along the surgical sites. No other areas of tenderness to bilateral lower extremities. MMT 5/5, ROM WNL.  No open lesions or pre-ulcerative lesions.  No overlying edema, erythema, increase in warmth to bilateral lower extremities.  No pain with calf compression, swelling, warmth, erythema bilaterally.       Assessment & Plan:  54 year old female status post left foot  surgery, doing well -X-rays were obtained and reviewed the patient. -Treatment options were discussed including alternatives, risks, complications -At this time I recommended continued immobilization in a Cam Walker however patient adamantly does not want to wear this. Because of this I did dispense a surgical shoe. -Dressing was reapplied over the incisions with antibiotic ointment and a bandage. -Ice and elevation -Pain medication as needed -Monitoring clinical signs or symptoms of infection and/or DVT/PE and directed to call the office immediately should any occur or go to the ER. -Follow-up in one week for likely suture removal or sooner if any problems are to arise. In the meantime call the office with any questions, concerns, change in symptoms.

## 2014-11-24 NOTE — Progress Notes (Signed)
DOS 11/17/2014  biplanr austin bunionectomy with pin fixation left foot, removal mass 3rd left

## 2014-12-03 ENCOUNTER — Ambulatory Visit (INDEPENDENT_AMBULATORY_CARE_PROVIDER_SITE_OTHER): Payer: 59

## 2014-12-03 ENCOUNTER — Ambulatory Visit (INDEPENDENT_AMBULATORY_CARE_PROVIDER_SITE_OTHER): Payer: 59 | Admitting: Podiatry

## 2014-12-03 ENCOUNTER — Encounter: Payer: Self-pay | Admitting: Podiatry

## 2014-12-03 VITALS — BP 135/90 | HR 72 | Resp 12

## 2014-12-03 DIAGNOSIS — M2012 Hallux valgus (acquired), left foot: Secondary | ICD-10-CM

## 2014-12-03 DIAGNOSIS — Z9889 Other specified postprocedural states: Secondary | ICD-10-CM

## 2014-12-03 DIAGNOSIS — M205X2 Other deformities of toe(s) (acquired), left foot: Secondary | ICD-10-CM

## 2014-12-04 NOTE — Progress Notes (Signed)
Subjective:     Patient ID: Kathleen Serrano, female   DOB: 1960/08/30, 54 y.o.   MRN: [id number]  HPI patient states I'm doing well with my left foot and able to walk without much discomfort or swelling   Review of Systems     Objective:   Physical Exam Neurovascular status intact muscle strength adequate with discomfort of a minimal nature left first metatarsal with wound edges well coapted hallux in rectus position and good range of motion    Assessment:     Doing well post Austin osteotomy left    Plan:     X-rays reviewed with patient and discussed continued immobilization elevation and compression. Reappoint 4 weeks and can return to more activities at this time

## 2014-12-13 ENCOUNTER — Telehealth: Payer: Self-pay | Admitting: Podiatry

## 2014-12-13 NOTE — Telephone Encounter (Signed)
Patient called the call service stating that over the site of the surgeries become more swollen and red and that she has a localized infection to the area. She denies any systemic complaints as fevers, chills, nausea, vomiting. She states that she has returned to regular shoe although the shoe does rub over the incision which she believes started this. For concerns of infection it did discuss the patient go to urgent care or the emergency room however should not 1 ago. I did call in Keflex 500 mg 3 times a day to the patients pharmacy. Recommended to return to the surgical shoe until follow-up.  Encouraged patient to follow-up with Dr. Paulla Dolly early next week for evaluation. If symptoms worsen to call the office or go directly to the emergency room.

## 2014-12-25 ENCOUNTER — Ambulatory Visit (INDEPENDENT_AMBULATORY_CARE_PROVIDER_SITE_OTHER): Payer: 59 | Admitting: Podiatry

## 2014-12-25 ENCOUNTER — Ambulatory Visit (INDEPENDENT_AMBULATORY_CARE_PROVIDER_SITE_OTHER): Payer: 59

## 2014-12-25 VITALS — BP 120/69 | HR 68 | Resp 16

## 2014-12-25 DIAGNOSIS — Z9889 Other specified postprocedural states: Secondary | ICD-10-CM

## 2014-12-25 DIAGNOSIS — M205X2 Other deformities of toe(s) (acquired), left foot: Secondary | ICD-10-CM

## 2014-12-25 DIAGNOSIS — D3610 Benign neoplasm of peripheral nerves and autonomic nervous system, unspecified: Secondary | ICD-10-CM

## 2014-12-25 NOTE — Progress Notes (Signed)
   Subjective:    Patient ID: Kathleen Serrano, female    DOB: Apr 08, 1961, 54 y.o.   MRN: [id number]  HPI "[place] the ocean and I hit a hole.  It was hurting a lot."    Review of Systems     Objective:   Physical Exam        Assessment & Plan:

## 2014-12-28 NOTE — Progress Notes (Signed)
Subjective:     Patient ID: Kathleen Serrano, female   DOB: Jun 21, 1961, 54 y.o.   MRN: [id number]  HPI patient states I hit my left foot and I was just concerned that it might have did something to the area that was operated on. It's been hurting and swollen but I'm still very pleased with the position   Review of Systems     Objective:   Physical Exam Neurovascular status intact muscle strength adequate with quite a bit of discomfort around the first MPJ left. The wound edges are well coapted and the alignment of the first metatarsal is good with range of motion excellent of the joint    Assessment:     Trauma to the left first MPJ that most likely is doing okay but we'll need to be checked    Plan:     X-ray reviewed and at this time allow patient to return to soft shoe gear explaining that discomfort is normal and that we'll probably set her back by few weeks by the trauma. Patient will take oral anti-inflammatory is an use ice and elevation

## 2015-02-11 ENCOUNTER — Ambulatory Visit (INDEPENDENT_AMBULATORY_CARE_PROVIDER_SITE_OTHER): Payer: 59 | Admitting: Internal Medicine

## 2015-02-11 ENCOUNTER — Encounter: Payer: Self-pay | Admitting: Internal Medicine

## 2015-02-11 VITALS — BP 124/62 | HR 66 | Temp 97.7°F | Ht 67.0 in | Wt 158.0 lb

## 2015-02-11 DIAGNOSIS — E063 Autoimmune thyroiditis: Secondary | ICD-10-CM | POA: Diagnosis not present

## 2015-02-11 DIAGNOSIS — E038 Other specified hypothyroidism: Secondary | ICD-10-CM

## 2015-02-11 LAB — T3, FREE: T3, Free: 3.3 pg/mL (ref 2.3–4.2)

## 2015-02-11 LAB — T4, FREE: FREE T4: 0.94 ng/dL (ref 0.60–1.60)

## 2015-02-11 LAB — TSH: TSH: 1.62 u[IU]/mL (ref 0.35–4.50)

## 2015-02-11 MED ORDER — SYNTHROID 50 MCG PO TABS: 50.0000 ug | ORAL_TABLET | Freq: Every day | ORAL | Status: DC

## 2015-02-11 NOTE — Progress Notes (Signed)
Patient ID: India Jolin, female   DOB: 04-02-1961, 54 y.o.   MRN: [id number]   HPI  Kit Brubacher is a 54 y.o.-year-old female, returning for f/u for for Hashimoto's hypothyroidism. Last visit 6 mo ago.  Reviewed hx: Pt. has been dx with hypothyroidism in 11/2011 - TSH 60. TPO Abx were high, at 290 (<34), with negative ATA.   She was started on generic Levothyroxine >> now brand name Synthroid 50 mcg, taken: - fasting - with water - separated by >30 min from b'fast  - no calcium, iron, PPIs, multivitamins   At previous visits, we decreased the Synthroid to 88 mcg, but a repeat TSH was still suppressed, so I advised her to decrease to 50 mcg. She is now taking this dose. Last set of TFTs normal:  Lab Results  Component Value Date   TSH 1.08 08/13/2014   TSH 1.78 02/09/2014   TSH 0.28* 01/08/2014   TSH 0.23* 11/21/2013   TSH 0.205* 09/15/2013   FREET4 1.01 08/13/2014   FREET4 1.01 02/09/2014   FREET4 0.97 01/08/2014   FREET4 1.06 11/21/2013  09/22/2013: TSH 2.248 08/05/2013: TSH 0.867, Ab's 51 (<34) >> Synthroid dose increased from 75 to 100 mcg  Pt does not feel lumps in neck, no hoarseness, no dysphagia/no odynophagia, no SOB with lying down.  Pt describes: - no fatigue - no more palpitations - no heat intolerance - no weight gain - no constipation - no dry skin - no hair loss  I reviewed her chart and she also has a history of insomnia and vit D def, fibromyalgia, psoriasis. Hysterectomy 2013. On HRT.  ROS: Constitutional: no weight gain/loss, no fatigue, no subjective hyperthermia/hypothermia Eyes: no blurry vision, no xerophthalmia ENT: no sore throat, no nodules palpated in throat, no dysphagia/odynophagia, no hoarseness Cardiovascular: no CP/SOB/palpitations/leg swelling Respiratory: no cough/SOB Gastrointestinal: no N/V/D/C Musculoskeletal: no muscle/joint aches Skin: no rashes Neurological: no tremors/numbness/tingling/dizziness Psychiatric: no  depression/anxiety  I reviewed pt's medications, allergies, PMH, social hx, family hx, and changes were documented in the history of present illness. Otherwise, unchanged from my initial visit note.  PE: BP 124/62 mmHg  Pulse 66  Temp(Src) 97.7 F (36.5 C) (Oral)  Ht 5\' 7"  (1.702 m)  Wt 158 lb (71.668 kg)  BMI 24.74 kg/m2  SpO2 98%  LMP 08/01/2012 Wt Readings from Last 3 Encounters:  02/11/15 158 lb (71.668 kg)  10/27/14 160 lb (72.576 kg)  08/13/14 157 lb (71.215 kg)   Constitutional: normal weight, in NAD Eyes: PERRLA, EOMI, no exophthalmos ENT: moist mucous membranes, + mild symmetric thyromegaly, no cervical lymphadenopathy Cardiovascular: RRR, No MRG Respiratory: CTA B Gastrointestinal: abdomen soft, NT, ND, BS+ Musculoskeletal: no deformities, strength intact in all 4 Skin: moist, warm, no rashes Neurological: no tremor with outstretched hands, DTR normal in all 4  ASSESSMENT: 1. Hashimoto's Hypothyroidism  PLAN:  1. Patient with 3 year h/o hypothyroidism, now with normal TFTs. She appears euthyroid.   - She is taking the levothyroxine correctly: fasting, with water, separated by at least 30 minutes from breakfast, and separated by more than 4 hours from calcium, iron, multivitamins, acid reflux medications (PPIs). - She does not appear to have a goiter, thyroid nodules, or neck compression symptoms - continue Synthroid 50 mcg for now - check thyroid tests today: TSH, free T4  - If these are abnormal, she will need to return in 6 weeks for repeat labs - If these are normal, I will see her back in 1 year  Needs refills  for 1 year.  Office Visit on 02/11/2015  Component Date Value Ref Range Status  . TSH 02/11/2015 1.62  0.35 - 4.50 uIU/mL Final  . Free T4 02/11/2015 0.94  0.60 - 1.60 ng/dL Final  . T3, Free 02/11/2015 3.3  2.3 - 4.2 pg/mL Final  Labs normal >> continue LT4 50 mcg daily.

## 2015-02-11 NOTE — Patient Instructions (Signed)
Please stop at the lab.  Please return in 1 year.  

## 2015-04-09 ENCOUNTER — Other Ambulatory Visit: Payer: Self-pay | Admitting: Internal Medicine

## 2015-04-13 ENCOUNTER — Other Ambulatory Visit: Payer: Self-pay

## 2015-04-13 DIAGNOSIS — Z1231 Encounter for screening mammogram for malignant neoplasm of breast: Secondary | ICD-10-CM

## 2015-04-16 ENCOUNTER — Ambulatory Visit: Payer: 59

## 2015-08-03 ENCOUNTER — Ambulatory Visit: Payer: 59 | Admitting: Internal Medicine

## 2015-08-17 ENCOUNTER — Ambulatory Visit: Payer: Self-pay | Admitting: Internal Medicine

## 2015-09-16 ENCOUNTER — Other Ambulatory Visit: Payer: Self-pay | Admitting: Internal Medicine

## 2015-10-18 ENCOUNTER — Other Ambulatory Visit: Payer: Self-pay | Admitting: Internal Medicine

## 2015-12-09 ENCOUNTER — Other Ambulatory Visit: Payer: Self-pay

## 2015-12-09 DIAGNOSIS — Z1231 Encounter for screening mammogram for malignant neoplasm of breast: Secondary | ICD-10-CM

## 2015-12-17 ENCOUNTER — Other Ambulatory Visit: Payer: Self-pay | Admitting: Family Medicine

## 2015-12-17 ENCOUNTER — Ambulatory Visit
Admission: RE | Admit: 2015-12-17 | Discharge: 2015-12-17 | Disposition: A | Discharge status: Home / Self Care | Payer: BLUE CROSS/BLUE SHIELD | Source: Ambulatory Visit

## 2015-12-17 DIAGNOSIS — Z1231 Encounter for screening mammogram for malignant neoplasm of breast: Secondary | ICD-10-CM

## 2016-04-06 ENCOUNTER — Other Ambulatory Visit: Payer: Self-pay | Admitting: Internal Medicine

## 2017-12-14 ENCOUNTER — Other Ambulatory Visit: Payer: Self-pay | Admitting: Family Medicine

## 2017-12-14 ENCOUNTER — Other Ambulatory Visit: Payer: Self-pay | Admitting: Internal Medicine

## 2017-12-14 DIAGNOSIS — Z1231 Encounter for screening mammogram for malignant neoplasm of breast: Secondary | ICD-10-CM

## 2017-12-14 DIAGNOSIS — E010 Iodine-deficiency related diffuse (endemic) goiter: Secondary | ICD-10-CM

## 2017-12-20 ENCOUNTER — Ambulatory Visit
Admission: RE | Admit: 2017-12-20 | Discharge: 2017-12-20 | Disposition: A | Discharge status: Home / Self Care | Payer: BLUE CROSS/BLUE SHIELD | Source: Ambulatory Visit | Attending: Internal Medicine | Admitting: Internal Medicine

## 2017-12-20 DIAGNOSIS — E010 Iodine-deficiency related diffuse (endemic) goiter: Secondary | ICD-10-CM

## 2018-01-04 ENCOUNTER — Ambulatory Visit
Admission: RE | Admit: 2018-01-04 | Discharge: 2018-01-04 | Disposition: A | Discharge status: Home / Self Care | Payer: BLUE CROSS/BLUE SHIELD | Source: Ambulatory Visit | Attending: Internal Medicine | Admitting: Internal Medicine

## 2018-01-04 DIAGNOSIS — Z1231 Encounter for screening mammogram for malignant neoplasm of breast: Secondary | ICD-10-CM

## 2018-11-22 IMAGING — US US THYROID
1 series · 13 of 25 positions shown · non-contrast
Comparison: None.

CLINICAL DATA: Palpable abnormality.  Thyromegaly on physical exam.

EXAM:
THYROID ULTRASOUND
TECHNIQUE: Ultrasound examination of the thyroid gland and adjacent soft
tissues was performed.

[Series 1: us thyroid · 0.04mm/px · 13 of 56 slices shown]
[im 1/56]
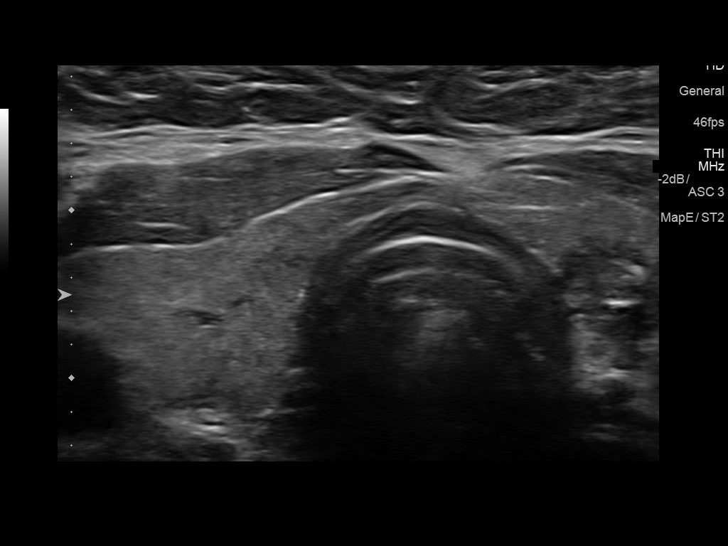
[im 5/56]
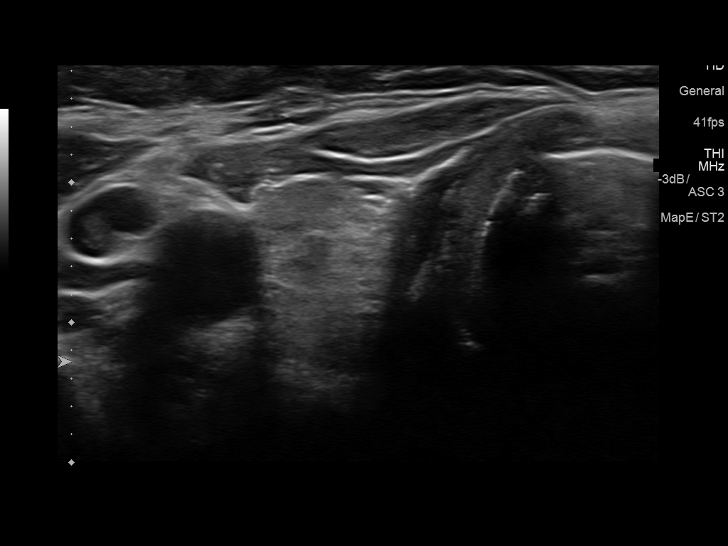
[im 10/56]
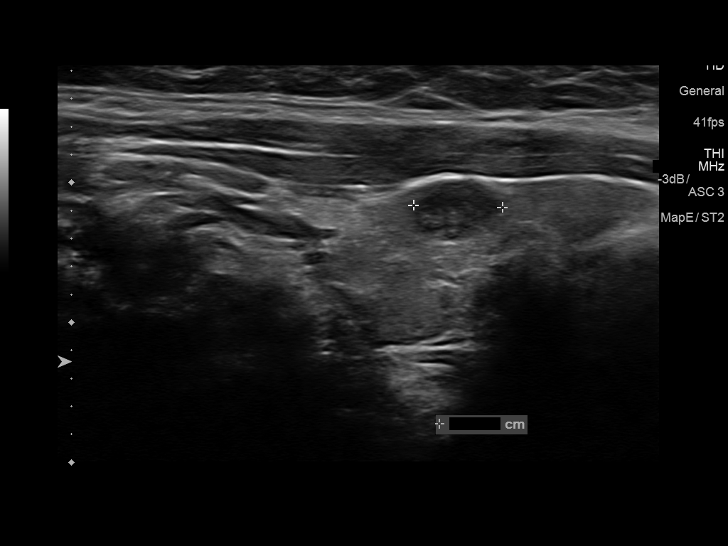
[im 14/56]
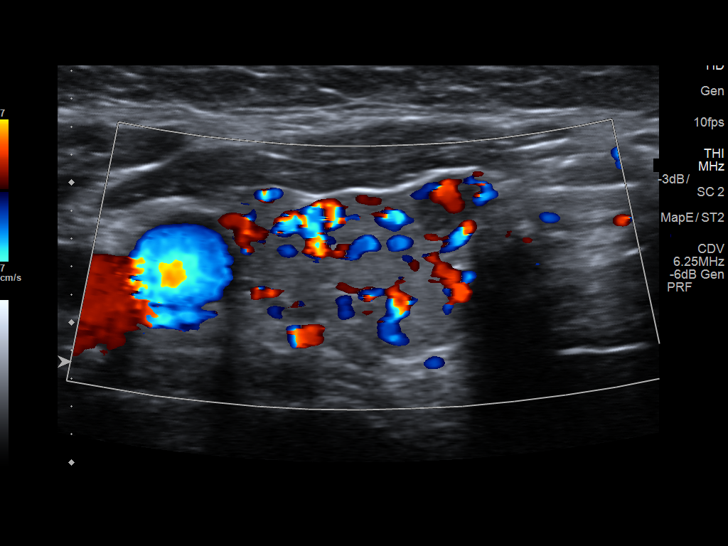
[im 19/56]
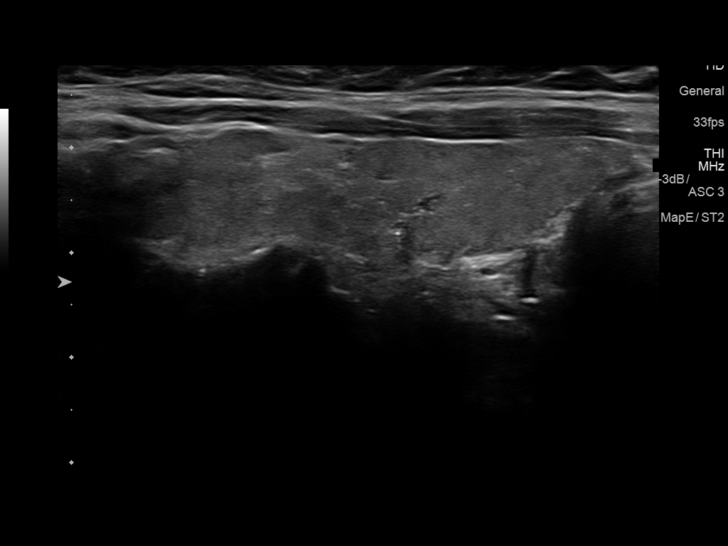
[im 23/56]
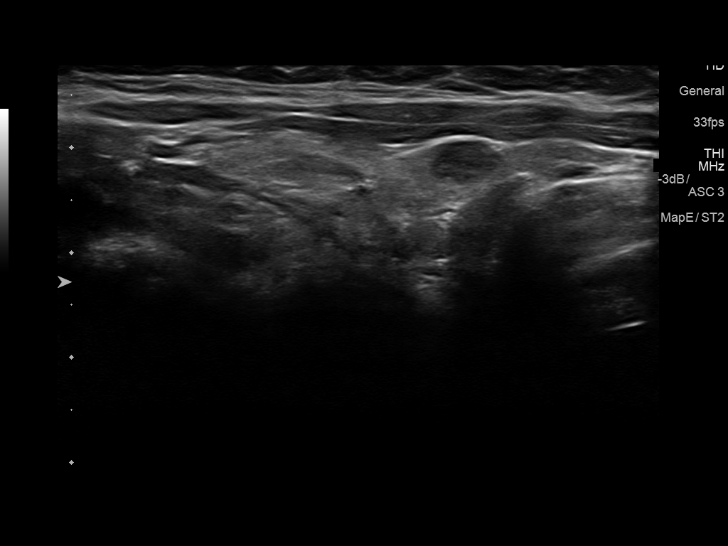
[im 28/56]
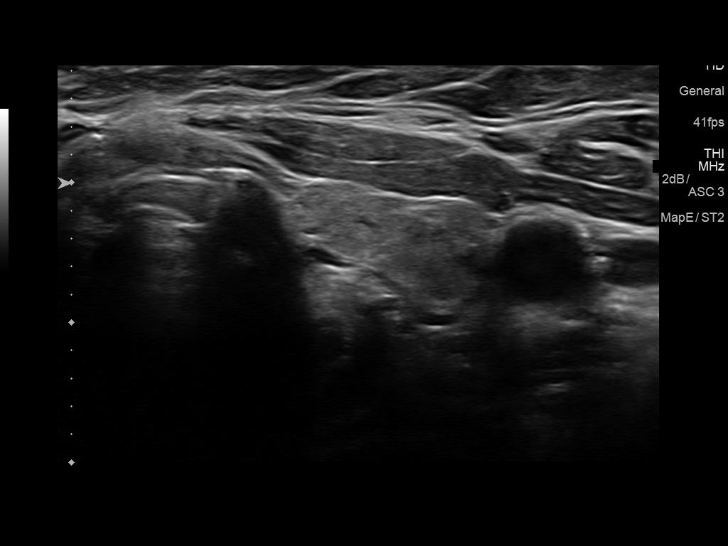
[im 33/56]
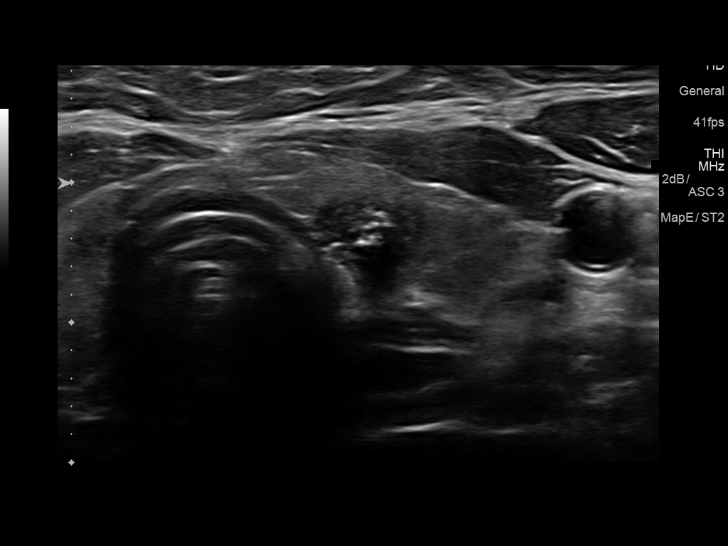
[im 37/56]
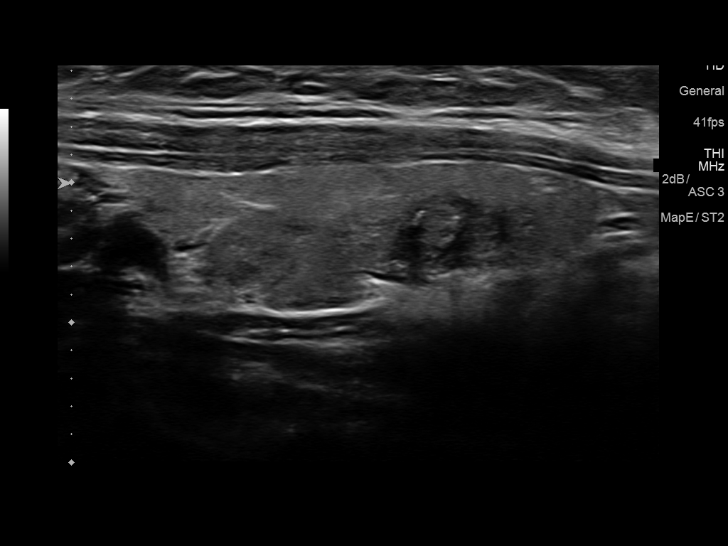
[im 42/56]
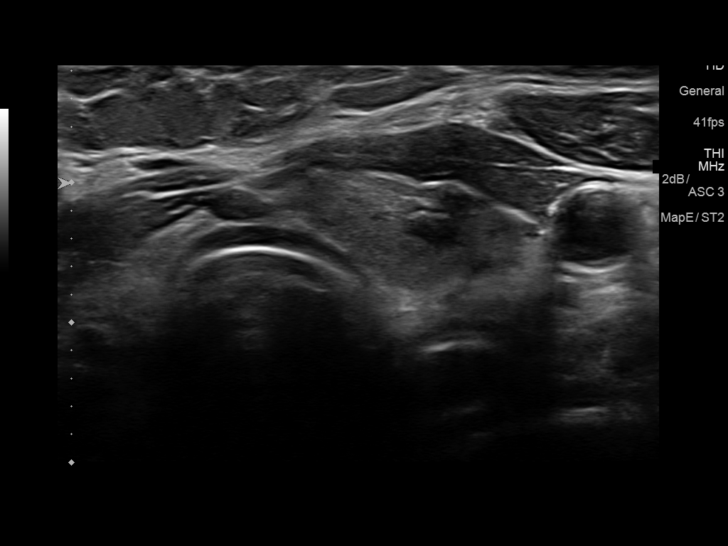
[im 46/56]
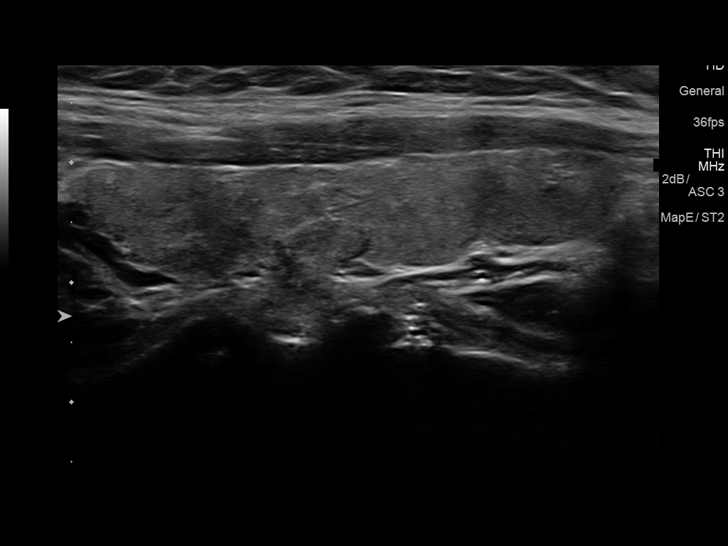
[im 51/56]
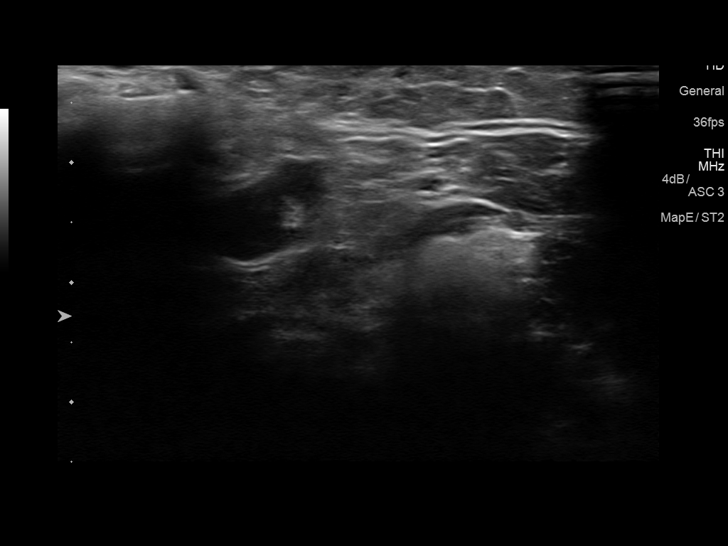
[im 56/56]
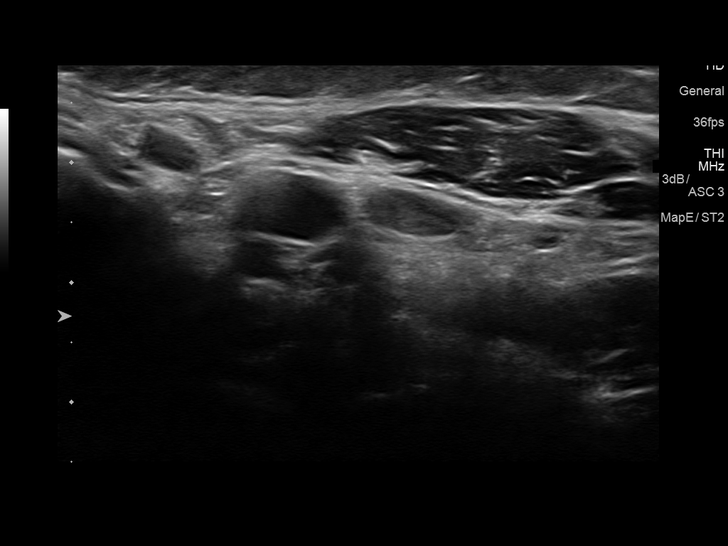

[13 of 25 positions shown; findings below may reference images not displayed]

FINDINGS: Parenchymal Echotexture: Mildly heterogenous

Isthmus: 0.2 cm

Right lobe: 4.9 x 1.2 x 1.7 cm

Left lobe: 4.7 x 1.0 x 1.4 cm

_________________________________________________________

Estimated total number of nodules >/= 1 cm: 0

Number of spongiform nodules >/=  2 cm not described below (TR1): 0

Number of mixed cystic and solid nodules >/= 1.5 cm not described
below (TR2): 0

_________________________________________________________

Nodule # 2:

Location: Left; Inferior

Maximum size: 0.8 cm; Other 2 dimensions: 0.8 x 0.7 cm

Composition: solid/almost completely solid (2)

Echogenicity: hypoechoic (2)

Shape: not taller-than-wide (0)

Margins: smooth (0)

Echogenic foci: punctate echogenic foci (3)

ACR TI-RADS total points: 7.

ACR TI-RADS risk category: TR5 (>/= 7 points).

ACR TI-RADS recommendations:

*Given size (>/= 0.5 - 0.9 cm) and appearance, a follow-up
ultrasound in 1 year should be considered based on TI-RADS criteria.

_________________________________________________________

0.6 cm right mid nodule does not meet criteria for biopsy nor
follow-up.
IMPRESSION: Left nodule 2 meets criteria for annual follow-up.

The above is in keeping with the ACR TI-RADS recommendations - [HOSPITAL] 2415;[DATE].

## 2018-11-29 ENCOUNTER — Other Ambulatory Visit: Payer: Self-pay | Admitting: Internal Medicine

## 2018-11-29 DIAGNOSIS — E041 Nontoxic single thyroid nodule: Secondary | ICD-10-CM
